# Patient Record
Sex: Female | Born: 1945
Health system: Southern US, Community
[De-identification: ages and names within clinical notes are randomized; demographics above are authoritative.]

## PROBLEM LIST (undated history)

## (undated) DIAGNOSIS — T7840XA Allergy, unspecified, initial encounter: Secondary | ICD-10-CM

## (undated) DIAGNOSIS — J449 Chronic obstructive pulmonary disease, unspecified: Secondary | ICD-10-CM

## (undated) DIAGNOSIS — I10 Essential (primary) hypertension: Secondary | ICD-10-CM

## (undated) DIAGNOSIS — E7800 Pure hypercholesterolemia, unspecified: Secondary | ICD-10-CM

## (undated) DIAGNOSIS — G709 Myoneural disorder, unspecified: Secondary | ICD-10-CM

## (undated) DIAGNOSIS — F329 Major depressive disorder, single episode, unspecified: Secondary | ICD-10-CM

## (undated) DIAGNOSIS — E079 Disorder of thyroid, unspecified: Secondary | ICD-10-CM

## (undated) DIAGNOSIS — J45909 Unspecified asthma, uncomplicated: Secondary | ICD-10-CM

## (undated) HISTORY — DX: Essential (primary) hypertension: I10

## (undated) HISTORY — DX: Disorder of thyroid, unspecified: E07.9

## (undated) HISTORY — PX: TUBAL LIGATION: SHX77

## (undated) HISTORY — DX: Unspecified asthma, uncomplicated: J45.909

## (undated) HISTORY — DX: Major depressive disorder, single episode, unspecified: F32.9

## (undated) HISTORY — DX: Chronic obstructive pulmonary disease, unspecified: J44.9

## (undated) HISTORY — DX: Myoneural disorder, unspecified: G70.9

## (undated) HISTORY — PX: FRACTURE SURGERY: SHX138

## (undated) HISTORY — PX: TONSILECTOMY/ADENOIDECTOMY WITH MYRINGOTOMY: SHX6125

## (undated) HISTORY — PX: SPINE SURGERY: SHX786

## (undated) HISTORY — DX: Allergy, unspecified, initial encounter: T78.40XA

---

## 2006-06-11 ENCOUNTER — Other Ambulatory Visit: Admission: RE | Admit: 2006-06-11 | Discharge: 2006-06-11 | Payer: Self-pay | Admitting: Internal Medicine

## 2006-10-30 ENCOUNTER — Encounter: Admission: RE | Admit: 2006-10-30 | Discharge: 2006-10-30 | Payer: Self-pay | Admitting: Family Medicine

## 2007-07-13 ENCOUNTER — Ambulatory Visit (HOSPITAL_COMMUNITY): Admission: RE | Admit: 2007-07-13 | Discharge: 2007-07-13 | Payer: Self-pay | Admitting: Internal Medicine

## 2007-10-17 ENCOUNTER — Ambulatory Visit (HOSPITAL_COMMUNITY): Admission: RE | Admit: 2007-10-17 | Discharge: 2007-10-18 | Payer: Self-pay | Admitting: Neurological Surgery

## 2008-05-09 ENCOUNTER — Encounter: Admission: RE | Admit: 2008-05-09 | Discharge: 2008-05-09 | Payer: Self-pay | Admitting: Family Medicine

## 2008-05-10 ENCOUNTER — Emergency Department (HOSPITAL_COMMUNITY): Admission: EM | Admit: 2008-05-10 | Discharge: 2008-05-10 | Payer: Self-pay | Admitting: Family Medicine

## 2008-07-13 ENCOUNTER — Ambulatory Visit (HOSPITAL_COMMUNITY): Admission: RE | Admit: 2008-07-13 | Discharge: 2008-07-13 | Payer: Self-pay | Admitting: Family Medicine

## 2009-08-01 ENCOUNTER — Encounter: Admission: RE | Admit: 2009-08-01 | Discharge: 2009-08-01 | Payer: Self-pay | Admitting: Family Medicine

## 2010-10-14 NOTE — Op Note (Signed)
Terry Allison, Terry Allison            ACCOUNT NO.:  [id number]   MEDICAL RECORD NO.:  [id number]          PATIENT TYPE:  INP   LOCATION:  3534                         FACILITY:  [facility]   PHYSICIAN:  Stefani Dama, M.D.  DATE OF BIRTH:  1945/08/20   DATE OF PROCEDURE:  10/17/2007  DATE OF DISCHARGE:                               OPERATIVE REPORT   PREOPERATIVE DIAGNOSES:  Cervical spondylosis with left cervical  radiculopathy, C5-6 and C6-7.   POSTOPERATIVE DIAGNOSES:  Cervical spondylosis with left cervical  radiculopathy, C5-6 and C6-7.   PROCEDURE:  Anterior cervical decompression, arthrodesis with structural  allograft, Alphatec plate fixation.   SURGEON:  Stefani Dama, MD.   FIRST ASSISTANT:  Hilda Lias, MD.   ANESTHESIA:  General endotracheal.   INDICATIONS:  Terry Allison is a 65 year old individual who has had  significant problems with neck, shoulder, and left arm pain.  She has  been inconsolable despite efforts at conservative management, was  advised regarding surgical decompression, arthrodesis at C5-6 and C6-7.  She has advanced spondylitic changes there.   PROCEDURE:  The patient was brought to the operating room supine on the  stretcher.  After smooth induction of general endotracheal anesthesia,  she was placed in 5 pounds halter traction.  Neck was prepped with  alcohol and DuraPrep, and draped in the sterile fashion.  Transverse  incision was made in left side of the neck, and this was carried down  through the platysmal plane between the sternocleidomastoid and the  strap muscles dissected bluntly until the prevertebral space was  reached.  First identifiable disk space was noted to be that of C5-C6 on  the localizing radiograph.  Longus coli muscle was stripped off either  side, and then Caspar blades were placed under the longus coli muscle to  maintain some permanent retraction.  Disk space at C5-6 was then opened  with a #15 blade.  Combination  of Kerrison rongeurs was used to remove  the substantial amount of ventral osteophytic material.  The disk space  was noted to be very narrow and compressed, and the disk material was  noted to be extremely desiccated.  It peeled away from either end plate  quite readily, and this dissection was carried out through the disk  space and then out to either lateral gutter.  The dissection was carried  down deep into the disk space, and self-retaining Caspar retractor was  placed deep into the wound, and then osteophytic material was ground  down from the inferior margin body of C5 out to the lateral recess; and  on the left side particularly, there was noted be substantial  osteophytic process from the uncinate spur process.  The uncinate  process was drilled down, and the spur was removed, and the lateral  recess was decompressed in this fashion.  The posterior longitudinal  ligament was opened all the way to expose the common dural tube in its  junction with the exit of the C6 nerve root.  On the right side, a  similar decompression was carried out.  Hemostasis in the epidural  spaces here was then  obtained with some small pledgets, Gelfoam soaked  in thrombin, which were later irrigated away.  At this point, also  attention to C6-C7 was undertaken, where a similar decompression was  carried out.  Here, the uncinate spur from the inferior margin body of  C6 was not quite as large as it was at C5-C6.  Lateral recess on the  left side, however, was significantly stenotic; and this was opened up  by drilling mostly on the uncinate process on that left side.  Right  side was similarly decompressed.  In the end, hemostasis was achieved in  the similar fashion.  Then, two 8-mm transgrafts were ground down to the  appropriate size and shaped to fit within the defect.  The central  cavities were enlarged, and they were filled with demineralized bone  matrix along with some of the bony material that  was harvested from  drilling of the uncinate spurs with the bone grafts being placed each  into the interspaces at C5-6 and C6-7.  There are countersunk  appropriately.  Traction could be removed.  Neck was placed in a slight  amount of flexion at this point, and a 34-mm standard size Alphatec  plate of the Trestle variety was used, and this was secured with  variable angle 14-mm screws.  Final localizing radiograph identified  good position of the plate and the hardware and fairly normal contour to  the cervical curvature.  At this point, then hemostasis was checked in  the soft tissues; and the paravertebral tissues were examined carefully.  Prevertebral hemostasis was also achieved; and once this was secured,  then the platysma was closed with 3-0 Vicryl interrupted fashion, 3-0  Vicryl was used in the subcuticular tissues, and a dry sterile dressing  was then placed on the skin.  Blood loss for the procedure was estimated  less than 50 mL.      Stefani Dama, M.D.  Electronically Signed     HJE/MEDQ  D:  10/17/2007  T:  10/18/2007  Job:  [id number]

## 2011-02-25 LAB — CBC
HCT: 36.9
MCHC: 35
Platelets: 190
RDW: 13.4

## 2011-02-25 LAB — BASIC METABOLIC PANEL
CO2: 25
Calcium: 9.4
GFR calc non Af Amer: >60
Glucose, Bld: 110 — ABNORMAL HIGH
Potassium: 4.1

## 2011-06-23 ENCOUNTER — Ambulatory Visit (INDEPENDENT_AMBULATORY_CARE_PROVIDER_SITE_OTHER): Payer: BC Managed Care – PPO

## 2011-06-23 DIAGNOSIS — E785 Hyperlipidemia, unspecified: Secondary | ICD-10-CM

## 2011-06-23 DIAGNOSIS — J40 Bronchitis, not specified as acute or chronic: Secondary | ICD-10-CM

## 2011-06-23 DIAGNOSIS — M545 Low back pain, unspecified: Secondary | ICD-10-CM

## 2011-06-23 DIAGNOSIS — E039 Hypothyroidism, unspecified: Secondary | ICD-10-CM

## 2011-06-23 DIAGNOSIS — Z79899 Other long term (current) drug therapy: Secondary | ICD-10-CM

## 2011-06-23 DIAGNOSIS — I10 Essential (primary) hypertension: Secondary | ICD-10-CM

## 2011-07-22 ENCOUNTER — Telehealth: Payer: Self-pay

## 2011-07-22 NOTE — Telephone Encounter (Signed)
Pt states that she has chest and head congestion.  She stated that she did get a little better and not as worse as she was before.  Advised pt she may have to come back in but she wanted to see if we could maybe call in something instead her coming back in.  Her chart is at nurses station. MR [id number]

## 2011-07-22 NOTE — Telephone Encounter (Signed)
Pt is still not feeling any better she is still stuffy with chest and head congestion, she would like to see if Dr will call another round of antibiotic in. [facility]

## 2011-07-23 NOTE — Telephone Encounter (Signed)
Spoke with pt and gave instrs that needs re-eval bf anything else could be Rxd. Pt understood

## 2011-07-23 NOTE — Telephone Encounter (Signed)
I don't see that we've seen her since 06/30/2011.  She'll need to return for re-eval.

## 2012-06-14 ENCOUNTER — Other Ambulatory Visit: Payer: Self-pay | Admitting: Internal Medicine

## 2012-06-14 NOTE — Telephone Encounter (Signed)
Chart pulled to PA pool at nurses station [phone]

## 2012-07-03 ENCOUNTER — Ambulatory Visit (INDEPENDENT_AMBULATORY_CARE_PROVIDER_SITE_OTHER): Payer: Medicare Other | Admitting: Family Medicine

## 2012-07-03 VITALS — BP 167/89 | HR 69 | Temp 97.9°F | Resp 18 | Ht 60.0 in | Wt 117.2 lb

## 2012-07-03 DIAGNOSIS — J310 Chronic rhinitis: Secondary | ICD-10-CM

## 2012-07-03 DIAGNOSIS — E785 Hyperlipidemia, unspecified: Secondary | ICD-10-CM

## 2012-07-03 DIAGNOSIS — I10 Essential (primary) hypertension: Secondary | ICD-10-CM

## 2012-07-03 DIAGNOSIS — E039 Hypothyroidism, unspecified: Secondary | ICD-10-CM

## 2012-07-03 LAB — COMPREHENSIVE METABOLIC PANEL
ALT: 14 U/L (ref 0–35)
Alkaline Phosphatase: 57 U/L (ref 39–117)
CO2: 20 mEq/L (ref 19–32)
Calcium: 10.1 mg/dL (ref 8.4–10.5)
Potassium: 4.6 mEq/L (ref 3.5–5.3)
Total Bilirubin: 0.5 mg/dL (ref 0.3–1.2)
Total Protein: 7.3 g/dL (ref 6.0–8.3)

## 2012-07-03 LAB — LIPID PANEL: Total CHOL/HDL Ratio: 4.4 Ratio

## 2012-07-03 MED ORDER — METOPROLOL TARTRATE 50 MG PO TABS: 50.0000 mg | ORAL_TABLET | Freq: Two times a day (BID) | ORAL | Status: DC

## 2012-07-03 MED ORDER — FLUTICASONE PROPIONATE 50 MCG/ACT NA SUSP: 2.0000 | Freq: Every day | NASAL | Status: DC

## 2012-07-03 MED ORDER — BENAZEPRIL HCL 20 MG PO TABS: 20.0000 mg | ORAL_TABLET | Freq: Two times a day (BID) | ORAL | Status: DC

## 2012-07-03 MED ORDER — LEVOTHYROXINE SODIUM 75 MCG PO TABS: 75.0000 ug | ORAL_TABLET | Freq: Every day | ORAL | Status: DC

## 2012-07-03 NOTE — Patient Instructions (Signed)
Take your medications faithfully.  Followup on your blood pressure  I would advise returning in about 6 months so we can review everything and more time since several things are not inordinate today, the blood pressure and cholesterol.  I will decide your cholesterol medication after seeing the results of your labs today.

## 2012-07-03 NOTE — Progress Notes (Signed)
Subjective: 67 year old lady who is here for the first time in over a year. She has a history of hypothyroidism, hypercholesterolemia, and hypertension. She is generally take her medications faithfully. However she did stop taking her cholesterol medication for over a month ago, the atorvastatin.  She also ran out of her metoprolol a few days ago. This is tax season, and she is getting busy as a IT trainer. She does not get much exercise this time the year. She has persistent rhinitis with stuffy nose and blowing stuff out. Otherwise she's been doing very well.  Objective: Pleasant lady in no major distress. TMs normal. Throat clear. Neck supple without nodes or thyromegaly. Chest is clear to auscultation. Heart regular without murmurs gallops or arrhythmias. Abdomen soft and nontender. No ankle edema.  Assessment: Hypertension, unsatisfactory control Hyperlipidemia Hypothyroidism Chronic rhinitis  Plan: Resume her regular medications except we will await and see with the Lipitor before putting her back on medication. She was reluctant to take a statin, worrying about her legs. She thinks it coming off of that may have helped her legs be a little less discomfort and easier to get up and down, however she realizes that cardiovascular risk. She has been unwilling to go back on for some. She just was taking one half of a 40 mg atorvastatin pill. Will treat her rhinitis with Flonase.  Results for orders placed in visit on 07/03/12  POCT GLYCOSYLATED HEMOGLOBIN (HGB A1C)      Component Value Range   Hemoglobin A1C 5.3

## 2012-07-04 ENCOUNTER — Encounter: Payer: Self-pay | Admitting: Family Medicine

## 2012-07-04 ENCOUNTER — Other Ambulatory Visit: Payer: Self-pay | Admitting: Family Medicine

## 2012-07-04 DIAGNOSIS — E7800 Pure hypercholesterolemia, unspecified: Secondary | ICD-10-CM

## 2012-07-04 MED ORDER — ATORVASTATIN CALCIUM 40 MG PO TABS: ORAL_TABLET | ORAL | Status: DC

## 2012-08-10 ENCOUNTER — Other Ambulatory Visit: Payer: Self-pay | Admitting: Internal Medicine

## 2012-10-24 ENCOUNTER — Other Ambulatory Visit: Payer: Self-pay | Admitting: Internal Medicine

## 2013-03-24 ENCOUNTER — Emergency Department (HOSPITAL_COMMUNITY)
Admission: EM | Admit: 2013-03-24 | Discharge: 2013-03-24 | Disposition: A | Discharge status: Home / Self Care | Payer: Medicare Other | Attending: Emergency Medicine | Admitting: Emergency Medicine

## 2013-03-24 ENCOUNTER — Encounter (HOSPITAL_COMMUNITY): Payer: Self-pay | Admitting: Emergency Medicine

## 2013-03-24 ENCOUNTER — Emergency Department (HOSPITAL_COMMUNITY): Payer: Medicare Other

## 2013-03-24 DIAGNOSIS — Y939 Activity, unspecified: Secondary | ICD-10-CM | POA: Insufficient documentation

## 2013-03-24 DIAGNOSIS — Z87891 Personal history of nicotine dependence: Secondary | ICD-10-CM | POA: Insufficient documentation

## 2013-03-24 DIAGNOSIS — J4489 Other specified chronic obstructive pulmonary disease: Secondary | ICD-10-CM | POA: Insufficient documentation

## 2013-03-24 DIAGNOSIS — S93602A Unspecified sprain of left foot, initial encounter: Secondary | ICD-10-CM

## 2013-03-24 DIAGNOSIS — S42302A Unspecified fracture of shaft of humerus, left arm, initial encounter for closed fracture: Secondary | ICD-10-CM

## 2013-03-24 DIAGNOSIS — S42209A Unspecified fracture of upper end of unspecified humerus, initial encounter for closed fracture: Secondary | ICD-10-CM | POA: Insufficient documentation

## 2013-03-24 DIAGNOSIS — Z8669 Personal history of other diseases of the nervous system and sense organs: Secondary | ICD-10-CM | POA: Insufficient documentation

## 2013-03-24 DIAGNOSIS — J449 Chronic obstructive pulmonary disease, unspecified: Secondary | ICD-10-CM | POA: Insufficient documentation

## 2013-03-24 DIAGNOSIS — W010XXA Fall on same level from slipping, tripping and stumbling without subsequent striking against object, initial encounter: Secondary | ICD-10-CM | POA: Insufficient documentation

## 2013-03-24 DIAGNOSIS — E079 Disorder of thyroid, unspecified: Secondary | ICD-10-CM | POA: Insufficient documentation

## 2013-03-24 DIAGNOSIS — Y9289 Other specified places as the place of occurrence of the external cause: Secondary | ICD-10-CM | POA: Insufficient documentation

## 2013-03-24 DIAGNOSIS — I10 Essential (primary) hypertension: Secondary | ICD-10-CM | POA: Insufficient documentation

## 2013-03-24 DIAGNOSIS — Z8659 Personal history of other mental and behavioral disorders: Secondary | ICD-10-CM | POA: Insufficient documentation

## 2013-03-24 DIAGNOSIS — S93609A Unspecified sprain of unspecified foot, initial encounter: Secondary | ICD-10-CM | POA: Insufficient documentation

## 2013-03-24 DIAGNOSIS — Z79899 Other long term (current) drug therapy: Secondary | ICD-10-CM | POA: Insufficient documentation

## 2013-03-24 DIAGNOSIS — Z7982 Long term (current) use of aspirin: Secondary | ICD-10-CM | POA: Insufficient documentation

## 2013-03-24 MED ORDER — FENTANYL CITRATE 0.05 MG/ML IJ SOLN: 50.0000 ug | INTRAMUSCULAR | Status: DC | PRN

## 2013-03-24 MED ORDER — ONDANSETRON HCL 4 MG/2ML IJ SOLN
4.0000 mg | Freq: Once | INTRAMUSCULAR | Status: AC
  Administered 2013-03-24: 4 mg via INTRAVENOUS

## 2013-03-24 MED ORDER — HYDROCODONE-ACETAMINOPHEN 5-325 MG PO TABS: 2.0000 | ORAL_TABLET | Freq: Four times a day (QID) | ORAL | Status: DC | PRN

## 2013-03-24 MED ORDER — SODIUM CHLORIDE 0.9 % IV SOLN
INTRAVENOUS | Status: DC
  Administered 2013-03-24: 02:00:00 via INTRAVENOUS

## 2013-03-24 MED ORDER — ONDANSETRON HCL 4 MG/2ML IJ SOLN
INTRAMUSCULAR | Status: AC
  Filled 2013-03-24: qty 2

## 2013-03-24 MED ORDER — HYDROMORPHONE HCL PF 1 MG/ML IJ SOLN
1.0000 mg | Freq: Once | INTRAMUSCULAR | Status: AC
  Administered 2013-03-24: 1 mg via INTRAVENOUS
  Filled 2013-03-24: qty 1

## 2013-03-24 MED ORDER — ONDANSETRON HCL 4 MG PO TABS: 4.0000 mg | ORAL_TABLET | Freq: Four times a day (QID) | ORAL | Status: DC

## 2013-03-24 NOTE — ED Notes (Signed)
Pt states she tripped over a pair of shoes coming out of the bathroom. Pt states she landed on her left shoulder. Pt complaining of pain to her left shoulder unable to move left arm. Pt denies LOC

## 2013-03-24 NOTE — ED Provider Notes (Signed)
CSN: [id number]     Arrival date & time 03/24/13  0038 History   First MD Initiated Contact with Patient 03/24/13 0053     Chief Complaint  Patient presents with  . Shoulder Injury   (Consider location/radiation/quality/duration/timing/severity/associated sxs/prior Treatment) HPI History provided by patient. At home tonight getting ready for bed and tripped over her slippers falling onto her left outstretched arm.  She injured her left shoulder. She denies striking her head or hurting her neck. No other injuries from this fall. She complains of severe sharp pain in the area of her shoulder, unable to move her shoulder due to sharp pain. No associated weakness or numbness. She denies any wrist or elbow pain.    She twisted her left foot the day before while walking. No fall. He is able to walk since this happened but has developed bruising over the top of her foot. She is requesting an x-ray. No ankle pain. No bleeding or skin breaks. Past Medical History  Diagnosis Date  . Allergy   . Asthma   . COPD (chronic obstructive pulmonary disease)   . Depression   . Neuromuscular disorder   . Thyroid disease   . Hypertension    Past Surgical History  Procedure Laterality Date  . Tubal ligation    . Spine surgery    . Tonsilectomy/adenoidectomy with myringotomy     History reviewed. No pertinent family history. History  Substance Use Topics  . Smoking status: Former Games developer  . Smokeless tobacco: Not on file     Comment: quit 15 yr ago  . Alcohol Use: Yes     Comment: daily 3-4 glasses of wine   OB History   Grav Para Term Preterm Abortions TAB SAB Ect Mult Living                 Review of Systems  Constitutional: Negative for fever and chills.  Eyes: Negative for visual disturbance.  Respiratory: Negative for shortness of breath.   Cardiovascular: Negative for chest pain.  Gastrointestinal: Negative for abdominal pain.  Genitourinary: Negative for dysuria.  Musculoskeletal:  Negative for back pain, neck pain and neck stiffness.  Skin: Positive for wound. Negative for rash.  Neurological: Negative for syncope, weakness and headaches.  All other systems reviewed and are negative.    Allergies  Tramadol  Home Medications   Current Outpatient Rx  Name  Route  Sig  Dispense  Refill  . aspirin EC 81 MG tablet   Oral   Take 81 mg by mouth daily.         Marland Kitchen atorvastatin (LIPITOR) 40 MG tablet   Oral   Take 20 mg by mouth daily.          . benazepril (LOTENSIN) 20 MG tablet   Oral   Take 1 tablet (20 mg total) by mouth 2 (two) times daily.   180 tablet   3   . levothyroxine (SYNTHROID, LEVOTHROID) 75 MCG tablet   Oral   Take 1 tablet (75 mcg total) by mouth daily.   90 tablet   3   . metoprolol (LOPRESSOR) 50 MG tablet   Oral   Take 1 tablet (50 mg total) by mouth 2 (two) times daily.   180 tablet   3    BP 145/67  Pulse 57  Temp(Src) 97.6 F (36.4 C) (Oral)  SpO2 99% Physical Exam  Constitutional: She is oriented to person, place, and time. She appears well-developed and well-nourished.  HENT:  Head: Normocephalic and atraumatic.  Eyes: EOM are normal. Pupils are equal, round, and reactive to light.  Neck: Neck supple.  No C-spine tenderness or deformity  Cardiovascular: Normal rate, regular rhythm and intact distal pulses.   Pulmonary/Chest: Effort normal and breath sounds normal. No respiratory distress.  Abdominal: Soft. She exhibits no distension. There is no tenderness.  Musculoskeletal:  Left upper extremity with tenderness over the proximal humerus. No tenderness or deformity to the shoulder. Decreased range of motion at the shoulder secondary to pain. No tenderness over elbow or wrist with distal neurovascular intact. Skin intact throughout. Left foot with ecchymosis and mild swelling over the dorsum. There is some mild tenderness. Skin intact. Good pulses in distal cap refill. Distal motor and sensorium intact. No tenderness  over the ankle. Gait intact  Neurological: She is alert and oriented to person, place, and time.  Skin: Skin is warm and dry.    ED Course  Procedures (including critical care time) Labs Review Labs Reviewed - No data to display Imaging Review Dg Shoulder Left  03/24/2013   CLINICAL DATA:  Pain and bruising of the left humerus after a fall.  EXAM: LEFT SHOULDER - 2+ VIEW  COMPARISON:  None.  FINDINGS: Comminuted fractures of the proximal left humerus with transverse fracture line along the left humeral neck and vertical fracture lines along the base of the greater tuberosity. Fracture line appears to extend to the inferior aspect of the glenohumeral joint. Mild impaction of the fracture fragments with mild lateral angulation of the distal fracture fragment. No glenohumeral dislocation. The acromioclavicular joint appears intact. Old appearing left rib fracture.  IMPRESSION: Comminuted fractures of the left proximal humerus.   Electronically Signed   By: Burman Nieves M.D.   On: 03/24/2013 01:56   Dg Humerus Left  03/24/2013   CLINICAL DATA:  Pain and bruising around the left humerus after a fall.  EXAM: LEFT HUMERUS - 2+ VIEW  COMPARISON:  None.  FINDINGS: There is a transverse fracture through the left humeral neck with comminuted fragments extending along the greater tuberosity. Mild impaction and displacement of fracture fragments. Fracture line appears to extend to the inferior glenohumeral joint. No additional humeral fractures. Incidental note of a old left rib fracture.  IMPRESSION: Comminuted fracture of the proximal left humerus with transverse fracture line along the humeral neck and vertical fracture line along the base of the greater tuberosity.   Electronically Signed   By: Burman Nieves M.D.   On: 03/24/2013 01:55   Dg Foot Complete Left  03/24/2013   CLINICAL DATA:  Pain and bruising of the left foot over the 2nd through 5th metatarsal area after a fall.  EXAM: LEFT FOOT -  COMPLETE 3+ VIEW  COMPARISON:  None.  FINDINGS: Mild degenerative changes in the 1st metatarsophalangeal joint. Old ununited ossicle adjacent to the navicular and cuboid old bones. No evidence of acute fracture or subluxation. No focal bone lesion or bone destruction. No radiopaque soft tissue foreign bodies.  IMPRESSION: No acute bony abnormalities.   Electronically Signed   By: Burman Nieves M.D.   On: 03/24/2013 01:58   IV dilaudid. IV Zofran. IV fluids. Sling provided X-rays reviewed with patient.  Plan discharge home with close orthopedic followup. Prescription for pain medications provided. Fracture instructions and precautions provided. Patient stable for discharge home with her husband MDM  Diagnosis: Closed fracture left humerus, sprain left foot  Evaluated with x-rays reviewed as above. Treated with IV narcotics Sling provided  Vital signs and nursing notes reviewed and considered   Sunnie Nielsen, MD 03/24/13 [phone]

## 2013-04-23 ENCOUNTER — Telehealth: Payer: Self-pay | Admitting: *Deleted

## 2013-04-23 NOTE — Telephone Encounter (Signed)
Called and spoke with patient reminding her to please schedule a mammogram before end of calendar year 2014.

## 2013-07-11 ENCOUNTER — Ambulatory Visit (INDEPENDENT_AMBULATORY_CARE_PROVIDER_SITE_OTHER): Payer: Medicare HMO | Admitting: Family Medicine

## 2013-07-11 ENCOUNTER — Other Ambulatory Visit: Payer: Self-pay | Admitting: Family Medicine

## 2013-07-11 VITALS — BP 134/76 | HR 52 | Temp 98.5°F | Resp 16 | Ht 59.5 in | Wt 111.6 lb

## 2013-07-11 DIAGNOSIS — R0982 Postnasal drip: Secondary | ICD-10-CM

## 2013-07-11 DIAGNOSIS — D7589 Other specified diseases of blood and blood-forming organs: Secondary | ICD-10-CM

## 2013-07-11 DIAGNOSIS — E785 Hyperlipidemia, unspecified: Secondary | ICD-10-CM

## 2013-07-11 DIAGNOSIS — E7800 Pure hypercholesterolemia, unspecified: Secondary | ICD-10-CM

## 2013-07-11 DIAGNOSIS — E039 Hypothyroidism, unspecified: Secondary | ICD-10-CM | POA: Insufficient documentation

## 2013-07-11 DIAGNOSIS — M549 Dorsalgia, unspecified: Secondary | ICD-10-CM

## 2013-07-11 DIAGNOSIS — I10 Essential (primary) hypertension: Secondary | ICD-10-CM

## 2013-07-11 LAB — COMPREHENSIVE METABOLIC PANEL
ALT: 20 U/L (ref 0–35)
AST: 27 U/L (ref 0–37)
Albumin: 4.1 g/dL (ref 3.5–5.2)
Alkaline Phosphatase: 62 U/L (ref 39–117)
BUN: 14 mg/dL (ref 6–23)
CO2: 22 mEq/L (ref 19–32)
Calcium: 9.2 mg/dL (ref 8.4–10.5)
Chloride: 109 mEq/L (ref 96–112)
Creat: 0.94 mg/dL (ref 0.50–1.10)
GLUCOSE: 70 mg/dL (ref 70–99)
Potassium: 4.7 mEq/L (ref 3.5–5.3)
SODIUM: 143 meq/L (ref 135–145)
TOTAL PROTEIN: 6.6 g/dL (ref 6.0–8.3)
Total Bilirubin: 0.5 mg/dL (ref 0.2–1.2)

## 2013-07-11 LAB — CBC
HEMATOCRIT: 38.1 % (ref 36.0–46.0)
Hemoglobin: 12.9 g/dL (ref 12.0–15.0)
MCH: 35.1 pg — ABNORMAL HIGH (ref 26.0–34.0)
MCHC: 33.9 g/dL (ref 30.0–36.0)
MCV: 103.5 fL — AB (ref 78.0–100.0)
Platelets: 188 10*3/uL (ref 150–400)
RBC: 3.68 MIL/uL — AB (ref 3.87–5.11)
RDW: 14.9 % (ref 11.5–15.5)
WBC: 5.2 10*3/uL (ref 4.0–10.5)

## 2013-07-11 LAB — LIPID PANEL
Cholesterol: 256 mg/dL — ABNORMAL HIGH (ref 0–200)
HDL: 77 mg/dL (ref >39)
Total CHOL/HDL Ratio: 3.3 Ratio
Triglycerides: 403 mg/dL — ABNORMAL HIGH (ref <150)

## 2013-07-11 LAB — TSH: TSH: 0.909 u[IU]/mL (ref 0.350–4.500)

## 2013-07-11 MED ORDER — LEVOTHYROXINE SODIUM 75 MCG PO TABS: 75.0000 ug | ORAL_TABLET | Freq: Every day | ORAL | Status: DC

## 2013-07-11 MED ORDER — BENAZEPRIL HCL 20 MG PO TABS: 20.0000 mg | ORAL_TABLET | Freq: Two times a day (BID) | ORAL | Status: DC

## 2013-07-11 MED ORDER — IPRATROPIUM BROMIDE 0.03 % NA SOLN: 2.0000 | Freq: Four times a day (QID) | NASAL | Status: DC

## 2013-07-11 MED ORDER — ATORVASTATIN CALCIUM 40 MG PO TABS: 20.0000 mg | ORAL_TABLET | Freq: Every day | ORAL | Status: DC

## 2013-07-11 MED ORDER — METHOCARBAMOL 500 MG PO TABS: 500.0000 mg | ORAL_TABLET | Freq: Four times a day (QID) | ORAL | Status: DC | PRN

## 2013-07-11 MED ORDER — METOPROLOL TARTRATE 50 MG PO TABS: 50.0000 mg | ORAL_TABLET | Freq: Two times a day (BID) | ORAL | Status: DC

## 2013-07-11 NOTE — Progress Notes (Signed)
[facility] [address], [name] [address] [phone]  Date:  07/11/2013   Name:  Terry Allison   DOB:  1945/11/21   MRN:  [id number]  PCP:  No PCP Per Patient    Chief Complaint: Medication Refill   History of Present Illness:  Terry Allison is a 68 y.o. very pleasant female patient who presents with the following:  She is here today for a refill of her medicatoins.  She has done well since we saw her last year except she did break her left arm in October. This is finally better.   She also notes sinus and throat congestion for several months- she does not have a cough, the sx are not in her chest.  She has clear mucus from he nose but nothing discolored.  She has tried flonase but did not have much luck.  She also does have a history of back trouble and has used some medication for pain in the past.  She has had surgery on her c- spine in the past.  She now notes lower back pain which has bothered her off and on for several months; she had been on vicodin for her arm fracture and since she stopped using this her pain is more noticeable.  The pain does not radiate to her legs, no numbness or weakness of her legs, and no bowel or bladder control issues.   She is fasting today or labs  There are no active problems to display for this patient.   Past Medical History  Diagnosis Date  . Allergy   . Asthma   . COPD (chronic obstructive pulmonary disease)   . Depression   . Neuromuscular disorder   . Thyroid disease   . Hypertension     Past Surgical History  Procedure Laterality Date  . Tubal ligation    . Spine surgery    . Tonsilectomy/adenoidectomy with myringotomy      History  Substance Use Topics  . Smoking status: Former Games developermoker  . Smokeless tobacco: Not on file     Comment: quit 15 yr ago  . Alcohol Use: Yes     Comment: daily 3-4 glasses of wine    Family History  Problem Relation Age of Onset  . Heart disease Father   .  Mental illness Father   . Cancer Sister   . Hyperlipidemia Sister     Allergies  Allergen Reactions  . Tramadol Nausea And Vomiting    Falling down     Medication list has been reviewed and updated.  Current Outpatient Prescriptions on File Prior to Visit  Medication Sig Dispense Refill  . aspirin EC 81 MG tablet Take 81 mg by mouth daily.      Marland Kitchen. atorvastatin (LIPITOR) 40 MG tablet Take 20 mg by mouth daily.       . benazepril (LOTENSIN) 20 MG tablet Take 1 tablet (20 mg total) by mouth 2 (two) times daily.  180 tablet  3  . levothyroxine (SYNTHROID, LEVOTHROID) 75 MCG tablet Take 1 tablet (75 mcg total) by mouth daily.  90 tablet  3  . metoprolol (LOPRESSOR) 50 MG tablet Take 1 tablet (50 mg total) by mouth 2 (two) times daily.  180 tablet  3  . HYDROcodone-acetaminophen (NORCO/VICODIN) 5-325 MG per tablet Take 2 tablets by mouth every 6 (six) hours as needed for pain.  20 tablet  0  . ondansetron (ZOFRAN) 4 MG tablet Take 1 tablet (4  mg total) by mouth every 6 (six) hours.  12 tablet  0   No current facility-administered medications on file prior to visit.    Review of Systems:  As per HPI- otherwise negative.   Physical Examination: Filed Vitals:   07/11/13 0944  BP: 134/76  Pulse: 52  Temp: 98.5 F (36.9 C)  Resp: 16   Filed Vitals:   07/11/13 0944  Height: 4' 11.5" (1.511 m)  Weight: 111 lb 9.6 oz (50.621 kg)   Body mass index is 22.17 kg/(m^2). Ideal Body Weight: Weight in (lb) to have BMI = 25: 125.6  GEN: WDWN, NAD, Non-toxic, A & O x 3, very petite person, looks well HEENT: Atraumatic, Normocephalic. Neck supple. No masses, No LAD.  Bilateral TM wnl, oropharynx normal.  PEERL,EOMI.   Ears and Nose: No external deformity. CV: RRR, No M/G/R. No JVD. No thrill. No extra heart sounds. PULM: CTA B, no wheezes, crackles, rhonchi. No retractions. No resp. distress. No accessory muscle use. ABD: S, NT, ND EXTR: No c/c/e NEURO Normal gait.  PSYCH: Normally  interactive. Conversant. Not depressed or anxious appearing.  Calm demeanor.  Back: negative SLR bilaterally, normal leg strength, sensation and DTR bilaterally. No back pain present today No tenderness over lumber spine or paraspinous muscles. Normal back flexion and extension  Assessment and Plan: Hypertension - Plan: benazepril (LOTENSIN) 20 MG tablet, metoprolol (LOPRESSOR) 50 MG tablet, CBC  Hypothyroidism - Plan: levothyroxine (SYNTHROID, LEVOTHROID) 75 MCG tablet, TSH  Other and unspecified hyperlipidemia - Plan: atorvastatin (LIPITOR) 40 MG tablet, Comprehensive metabolic panel, Lipid panel  Back pain - Plan: methocarbamol (ROBAXIN) 500 MG tablet  PND (post-nasal drip) - Plan: ipratropium (ATROVENT) 0.03 % nasal spray  bp controlled, continue medications Check TSH and refilled synthroid today Check FLP and refilled lipitor Try atrovent nasal for her nasal symptoms Robaxin as needed for back pain  Signed Abbe Amsterdam, MD

## 2013-07-11 NOTE — Patient Instructions (Addendum)
Good to see you today. I will be in touch with your labs when they come in.   Try the atrovent nasal spray as needed for your nasal and throat symptoms You can also try the robaxin (muscle relaxer) as needed for your back. This CAN make you drowsy so use with caution.  It is also ok to try OTC ibuprofen or tylenol along with this.    Let me know if your nasal/ sinus or back symptoms are not improving

## 2013-07-13 ENCOUNTER — Encounter: Payer: Self-pay | Admitting: Family Medicine

## 2013-07-14 LAB — FOLATE: Folate: 6.1 ng/mL

## 2013-07-14 LAB — VITAMIN B12: Vitamin B-12: 345 pg/mL (ref 211–911)

## 2013-07-15 ENCOUNTER — Encounter: Payer: Self-pay | Admitting: Family Medicine

## 2013-07-15 NOTE — Addendum Note (Signed)
Addended by: Abbe AmsterdamOPLAND, Kaleab Frasier C on: 07/15/2013 07:29 AM   Modules accepted: Orders

## 2013-07-27 ENCOUNTER — Other Ambulatory Visit: Payer: Self-pay | Admitting: Family Medicine

## 2013-07-27 DIAGNOSIS — E785 Hyperlipidemia, unspecified: Secondary | ICD-10-CM

## 2013-07-27 DIAGNOSIS — S39012A Strain of muscle, fascia and tendon of lower back, initial encounter: Secondary | ICD-10-CM

## 2013-07-28 NOTE — Telephone Encounter (Signed)
Dr Patsy Lageropland, do you want to give pt RFs or RTC? If so, pls review quantity and # of RFs.

## 2013-07-29 NOTE — Telephone Encounter (Signed)
I refilled this medication.  Please give her a call and find out how her back pain is doing

## 2013-07-31 NOTE — Telephone Encounter (Signed)
Pt stated that she thinks the Robaxin is really helping her back pain. She did get a notice though from ins advising that it needs a PA or change to preferred med and they only gave a 10 day supply. Pt will CB later to give me more details of meds used in past in order to complete PA.

## 2013-08-01 MED ORDER — ATORVASTATIN CALCIUM 40 MG PO TABS: 40.0000 mg | ORAL_TABLET | Freq: Every day | ORAL | Status: DC

## 2013-08-01 NOTE — Telephone Encounter (Signed)
Pt CB and advised she has been on lyrica, celebrex and neurontin in the past for her back pain. She has not used another muscle relaxant in the past. Pt also asked that I resend in corrected Rx for her lipitor which Dr Patsy Lageropland had increased dose to a whole 40 mg tab, but # did not increase so she will run out in 1 1/2 mos. Resent lipitor per note on 07/13/13 letter from Dr Patsy Lageropland.

## 2013-08-01 NOTE — Telephone Encounter (Signed)
Completed PA form [facility] and faxed back to them w/confirmation. Should have decision w/in 48-72 hrs.

## 2013-08-01 NOTE — Addendum Note (Signed)
Addended by: Sheppard PlumberBRIGGS, Chrishawna Farina A on: 08/01/2013 11:03 AM   Modules accepted: Orders

## 2013-08-03 ENCOUNTER — Telehealth: Payer: Self-pay

## 2013-08-03 NOTE — Telephone Encounter (Signed)
PA approved for methocarbamol through 05/31/14. Pt notified.

## 2013-12-19 ENCOUNTER — Ambulatory Visit (INDEPENDENT_AMBULATORY_CARE_PROVIDER_SITE_OTHER): Payer: Medicare HMO | Admitting: Internal Medicine

## 2013-12-19 VITALS — BP 128/74 | HR 60 | Temp 98.1°F | Resp 16 | Ht 59.5 in | Wt 111.0 lb

## 2013-12-19 DIAGNOSIS — N3001 Acute cystitis with hematuria: Secondary | ICD-10-CM

## 2013-12-19 DIAGNOSIS — R3915 Urgency of urination: Secondary | ICD-10-CM

## 2013-12-19 DIAGNOSIS — N3000 Acute cystitis without hematuria: Secondary | ICD-10-CM

## 2013-12-19 DIAGNOSIS — R300 Dysuria: Secondary | ICD-10-CM

## 2013-12-19 LAB — POCT URINALYSIS DIPSTICK
BILIRUBIN UA: NEGATIVE
Glucose, UA: NEGATIVE
Ketones, UA: NEGATIVE
Nitrite, UA: NEGATIVE
PH UA: 5
Protein, UA: 30
Spec Grav, UA: <=1.005
Urobilinogen, UA: 0.2

## 2013-12-19 LAB — POCT UA - MICROSCOPIC ONLY
Casts, Ur, LPF, POC: NEGATIVE
Crystals, Ur, HPF, POC: NEGATIVE
MUCUS UA: NEGATIVE
Yeast, UA: NEGATIVE

## 2013-12-19 MED ORDER — CIPROFLOXACIN HCL 500 MG PO TABS: 500.0000 mg | ORAL_TABLET | Freq: Two times a day (BID) | ORAL | Status: DC

## 2013-12-19 MED ORDER — PHENAZOPYRIDINE HCL 200 MG PO TABS: 200.0000 mg | ORAL_TABLET | Freq: Three times a day (TID) | ORAL | Status: DC | PRN

## 2013-12-19 NOTE — Progress Notes (Signed)
   Subjective:    Patient ID: Terry Allison, female    DOB: 09/26/1945, 68 y.o.   MRN: [id number]  HPI 68 year old female complains of frequent urination which started yesterday. When going this morning she saw a little blood in her urine.She has had no fevers, chills or abdominal pain. Stools are normal.  States this is first uti in years, and she is catching it early. Had utis as a child.  Review of Systems     Objective:   Physical Exam  Constitutional: She is oriented to person, place, and time. She appears well-developed and well-nourished. No distress.  HENT:  Head: Normocephalic.  Eyes: EOM are normal.  Neck: Normal range of motion.  Abdominal: There is no tenderness.  Neurological: She is alert and oriented to person, place, and time. No cranial nerve deficit. She exhibits normal muscle tone. Coordination normal.  Psychiatric: She has a normal mood and affect.     Results for orders placed in visit on 12/19/13  POCT UA - MICROSCOPIC ONLY      Result Value Ref Range   WBC, Ur, HPF, POC TNTC     RBC, urine, microscopic TNTC     Bacteria, U Microscopic TRACE     Mucus, UA NEG     Epithelial cells, urine per micros 2-6     Crystals, Ur, HPF, POC NEG     Casts, Ur, LPF, POC NEG     Yeast, UA NEG    POCT URINALYSIS DIPSTICK      Result Value Ref Range   Color, UA YELLOW     Clarity, UA CLOUDY     Glucose, UA NEG     Bilirubin, UA NEG     Ketones, UA NEG     Spec Grav, UA <=1.005     Blood, UA LARGE     pH, UA 5.0     Protein, UA 30     Urobilinogen, UA 0.2     Nitrite, UA NEG     Leukocytes, UA moderate (2+)     CS urine     Assessment & Plan:  Cipro/Pyridium

## 2013-12-19 NOTE — Patient Instructions (Signed)

## 2013-12-21 LAB — URINE CULTURE: Colony Count: >=100000

## 2014-03-01 ENCOUNTER — Emergency Department (HOSPITAL_COMMUNITY): Payer: Medicare HMO

## 2014-03-01 ENCOUNTER — Encounter (HOSPITAL_COMMUNITY): Payer: Self-pay | Admitting: Radiology

## 2014-03-01 ENCOUNTER — Inpatient Hospital Stay (HOSPITAL_COMMUNITY)
Admission: EM | Admit: 2014-03-01 | Discharge: 2014-03-09 | DRG: 492 | Disposition: A | Discharge status: Skilled Nursing Facility | Payer: Medicare HMO | Attending: General Surgery | Admitting: General Surgery

## 2014-03-01 ENCOUNTER — Inpatient Hospital Stay (HOSPITAL_COMMUNITY): Payer: Medicare HMO

## 2014-03-01 DIAGNOSIS — S82401A Unspecified fracture of shaft of right fibula, initial encounter for closed fracture: Secondary | ICD-10-CM | POA: Diagnosis present

## 2014-03-01 DIAGNOSIS — D62 Acute posthemorrhagic anemia: Secondary | ICD-10-CM | POA: Diagnosis not present

## 2014-03-01 DIAGNOSIS — I10 Essential (primary) hypertension: Secondary | ICD-10-CM | POA: Diagnosis present

## 2014-03-01 DIAGNOSIS — F10129 Alcohol abuse with intoxication, unspecified: Secondary | ICD-10-CM | POA: Diagnosis present

## 2014-03-01 DIAGNOSIS — S065XAA Traumatic subdural hemorrhage with loss of consciousness status unknown, initial encounter: Secondary | ICD-10-CM | POA: Diagnosis present

## 2014-03-01 DIAGNOSIS — E785 Hyperlipidemia, unspecified: Secondary | ICD-10-CM | POA: Diagnosis present

## 2014-03-01 DIAGNOSIS — S82141A Displaced bicondylar fracture of right tibia, initial encounter for closed fracture: Secondary | ICD-10-CM | POA: Diagnosis present

## 2014-03-01 DIAGNOSIS — S82409A Unspecified fracture of shaft of unspecified fibula, initial encounter for closed fracture: Secondary | ICD-10-CM | POA: Diagnosis present

## 2014-03-01 DIAGNOSIS — S2239XA Fracture of one rib, unspecified side, initial encounter for closed fracture: Secondary | ICD-10-CM | POA: Diagnosis present

## 2014-03-01 DIAGNOSIS — S065X9A Traumatic subdural hemorrhage with loss of consciousness of unspecified duration, initial encounter: Secondary | ICD-10-CM | POA: Diagnosis present

## 2014-03-01 DIAGNOSIS — S42001A Fracture of unspecified part of right clavicle, initial encounter for closed fracture: Secondary | ICD-10-CM | POA: Diagnosis present

## 2014-03-01 DIAGNOSIS — F10929 Alcohol use, unspecified with intoxication, unspecified: Secondary | ICD-10-CM | POA: Diagnosis present

## 2014-03-01 DIAGNOSIS — S129XXA Fracture of neck, unspecified, initial encounter: Secondary | ICD-10-CM | POA: Diagnosis present

## 2014-03-01 DIAGNOSIS — T07XXXA Unspecified multiple injuries, initial encounter: Secondary | ICD-10-CM

## 2014-03-01 DIAGNOSIS — S0101XA Laceration without foreign body of scalp, initial encounter: Secondary | ICD-10-CM | POA: Diagnosis present

## 2014-03-01 DIAGNOSIS — S82892A Other fracture of left lower leg, initial encounter for closed fracture: Secondary | ICD-10-CM | POA: Diagnosis present

## 2014-03-01 DIAGNOSIS — S066XAA Traumatic subarachnoid hemorrhage with loss of consciousness status unknown, initial encounter: Secondary | ICD-10-CM | POA: Diagnosis present

## 2014-03-01 DIAGNOSIS — S42302K Unspecified fracture of shaft of humerus, left arm, subsequent encounter for fracture with nonunion: Secondary | ICD-10-CM | POA: Diagnosis not present

## 2014-03-01 DIAGNOSIS — S066X9A Traumatic subarachnoid hemorrhage with loss of consciousness of unspecified duration, initial encounter: Secondary | ICD-10-CM | POA: Diagnosis present

## 2014-03-01 DIAGNOSIS — E039 Hypothyroidism, unspecified: Secondary | ICD-10-CM | POA: Diagnosis present

## 2014-03-01 DIAGNOSIS — S2232XA Fracture of one rib, left side, initial encounter for closed fracture: Secondary | ICD-10-CM | POA: Diagnosis present

## 2014-03-01 DIAGNOSIS — S12500A Unspecified displaced fracture of sixth cervical vertebra, initial encounter for closed fracture: Secondary | ICD-10-CM | POA: Diagnosis present

## 2014-03-01 DIAGNOSIS — S82202A Unspecified fracture of shaft of left tibia, initial encounter for closed fracture: Secondary | ICD-10-CM | POA: Diagnosis present

## 2014-03-01 DIAGNOSIS — J449 Chronic obstructive pulmonary disease, unspecified: Secondary | ICD-10-CM | POA: Diagnosis present

## 2014-03-01 LAB — CBC
HCT: 38.4 % (ref 36.0–46.0)
Hemoglobin: 12.8 g/dL (ref 12.0–15.0)
MCH: 35.8 pg — ABNORMAL HIGH (ref 26.0–34.0)
MCHC: 33.3 g/dL (ref 30.0–36.0)
MCV: 107.3 fL — ABNORMAL HIGH (ref 78.0–100.0)
Platelets: 205 10*3/uL (ref 150–400)
RBC: 3.58 MIL/uL — ABNORMAL LOW (ref 3.87–5.11)
RDW: 13.7 % (ref 11.5–15.5)
WBC: 10.9 10*3/uL — AB (ref 4.0–10.5)

## 2014-03-01 LAB — COMPREHENSIVE METABOLIC PANEL
ALBUMIN: 4 g/dL (ref 3.5–5.2)
ALK PHOS: 74 U/L (ref 39–117)
ALT: 37 U/L — ABNORMAL HIGH (ref 0–35)
ANION GAP: 18 — AB (ref 5–15)
AST: 132 U/L — ABNORMAL HIGH (ref 0–37)
BILIRUBIN TOTAL: 0.3 mg/dL (ref 0.3–1.2)
BUN: 19 mg/dL (ref 6–23)
CO2: 21 mEq/L (ref 19–32)
Calcium: 9 mg/dL (ref 8.4–10.5)
Chloride: 104 mEq/L (ref 96–112)
Creatinine, Ser: 1.23 mg/dL — ABNORMAL HIGH (ref 0.50–1.10)
GFR calc non Af Amer: 45 mL/min — ABNORMAL LOW (ref >90)
GFR, EST AFRICAN AMERICAN: 52 mL/min — AB (ref >90)
GLUCOSE: 110 mg/dL — AB (ref 70–99)
POTASSIUM: 4.1 meq/L (ref 3.7–5.3)
Sodium: 143 mEq/L (ref 137–147)
TOTAL PROTEIN: 7.1 g/dL (ref 6.0–8.3)

## 2014-03-01 LAB — PREPARE FRESH FROZEN PLASMA
UNIT DIVISION: 0
Unit division: 0

## 2014-03-01 LAB — ABO/RH: ABO/RH(D): O POS

## 2014-03-01 LAB — PROTIME-INR
INR: 0.95 (ref 0.00–1.49)
PROTHROMBIN TIME: 12.7 s (ref 11.6–15.2)

## 2014-03-01 LAB — ETHANOL: Alcohol, Ethyl (B): 194 mg/dL — ABNORMAL HIGH (ref 0–11)

## 2014-03-01 LAB — CDS SEROLOGY

## 2014-03-01 MED ORDER — SODIUM CHLORIDE 0.9 % IV BOLUS (SEPSIS)
125.0000 mL | Freq: Once | INTRAVENOUS | Status: AC
  Administered 2014-03-01: 1000 mL via INTRAVENOUS

## 2014-03-01 MED ORDER — FENTANYL CITRATE 0.05 MG/ML IJ SOLN
50.0000 ug | Freq: Once | INTRAMUSCULAR | Status: AC
  Administered 2014-03-01: 50 ug via INTRAVENOUS
  Filled 2014-03-01: qty 2

## 2014-03-01 MED ORDER — FENTANYL CITRATE 0.05 MG/ML IJ SOLN
INTRAMUSCULAR | Status: AC
  Administered 2014-03-01: 50 ug
  Filled 2014-03-01: qty 2

## 2014-03-01 MED ORDER — FENTANYL CITRATE 0.05 MG/ML IJ SOLN
50.0000 ug | Freq: Once | INTRAMUSCULAR | Status: AC
  Administered 2014-03-01: 50 ug via INTRAVENOUS

## 2014-03-01 MED ORDER — IOHEXOL 300 MG/ML  SOLN
80.0000 mL | Freq: Once | INTRAMUSCULAR | Status: AC | PRN
  Administered 2014-03-01: 80 mL via INTRAVENOUS

## 2014-03-01 NOTE — ED Notes (Signed)
Port x-ray at bedside for right shoulder x-ray.  Pt remains alert and oriented x's 3.  Pt's son remains at bedside.

## 2014-03-01 NOTE — ED Notes (Signed)
Family at beside. Family given emotional support. 

## 2014-03-01 NOTE — ED Notes (Signed)
Pt to CT at this time.
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
## 2014-03-01 NOTE — ED Notes (Signed)
Pt remains alert and oriented x's 3, no active bleeding present at this time.
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
## 2014-03-01 NOTE — ED Notes (Signed)
Long leg splint applied by ortho tech

## 2014-03-01 NOTE — Progress Notes (Signed)
Orthopedic Tech Progress Note Patient Details:  Terry Jakschlizabeth H Houp [birth year] [id number]  Ortho Devices Type of Ortho Device: Ace wrap;Long leg splint Ortho Device/Splint Location: rlr Ortho Device/Splint Interventions: Application As ordered by Dr. Art BuffByerley  Teyanna Thielman 03/01/2014, 9:56 PM

## 2014-03-01 NOTE — ED Notes (Signed)
Pt remains alert and oriented x's 3.  Skin warm and dry, color appropriate.

## 2014-03-01 NOTE — ED Notes (Signed)
Return from CT.  Pt remains alert and oriented x's 3.  Pt c/o increasing pain to right leg also c/o pain to right shoulder.  Pain meds. Ordered for same.

## 2014-03-01 NOTE — ED Notes (Signed)
Dr. Wandra Feinstein. Murphy applied knee immobolizer over splint to right leg and talking to family
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
## 2014-03-01 NOTE — ED Notes (Signed)
Heat turned up in room.  Warm blankets applied to pt 

## 2014-03-02 ENCOUNTER — Inpatient Hospital Stay (HOSPITAL_COMMUNITY): Payer: Medicare HMO

## 2014-03-02 DIAGNOSIS — S42001A Fracture of unspecified part of right clavicle, initial encounter for closed fracture: Secondary | ICD-10-CM | POA: Diagnosis present

## 2014-03-02 LAB — BASIC METABOLIC PANEL
Anion gap: 13 (ref 5–15)
BUN: 15 mg/dL (ref 6–23)
CALCIUM: 7.9 mg/dL — AB (ref 8.4–10.5)
CO2: 21 mEq/L (ref 19–32)
CREATININE: 0.95 mg/dL (ref 0.50–1.10)
Chloride: 104 mEq/L (ref 96–112)
GFR calc Af Amer: 71 mL/min — ABNORMAL LOW (ref >90)
GFR, EST NON AFRICAN AMERICAN: 61 mL/min — AB (ref >90)
Glucose, Bld: 152 mg/dL — ABNORMAL HIGH (ref 70–99)
Potassium: 4.5 mEq/L (ref 3.7–5.3)
Sodium: 138 mEq/L (ref 137–147)

## 2014-03-02 LAB — URINE MICROSCOPIC-ADD ON

## 2014-03-02 LAB — CBC WITH DIFFERENTIAL/PLATELET
BASOS PCT: 0 % (ref 0–1)
Basophils Absolute: 0 10*3/uL (ref 0.0–0.1)
EOS PCT: 0 % (ref 0–5)
Eosinophils Absolute: 0 10*3/uL (ref 0.0–0.7)
HCT: 25.8 % — ABNORMAL LOW (ref 36.0–46.0)
HEMOGLOBIN: 8.5 g/dL — AB (ref 12.0–15.0)
Lymphocytes Relative: 33 % (ref 12–46)
Lymphs Abs: 2.7 10*3/uL (ref 0.7–4.0)
MCH: 35.4 pg — AB (ref 26.0–34.0)
MCHC: 32.9 g/dL (ref 30.0–36.0)
MCV: 107.5 fL — AB (ref 78.0–100.0)
MONOS PCT: 11 % (ref 3–12)
Monocytes Absolute: 0.9 10*3/uL (ref 0.1–1.0)
NEUTROS PCT: 56 % (ref 43–77)
Neutro Abs: 4.7 10*3/uL (ref 1.7–7.7)
PLATELETS: 156 10*3/uL (ref 150–400)
RBC: 2.4 MIL/uL — ABNORMAL LOW (ref 3.87–5.11)
RDW: 13.8 % (ref 11.5–15.5)
WBC: 8.4 10*3/uL (ref 4.0–10.5)

## 2014-03-02 LAB — CBC
HCT: 25.6 % — ABNORMAL LOW (ref 36.0–46.0)
HCT: 23.7 % — ABNORMAL LOW (ref 36.0–46.0)
HEMOGLOBIN: 7.9 g/dL — AB (ref 12.0–15.0)
Hemoglobin: 8.3 g/dL — ABNORMAL LOW (ref 12.0–15.0)
MCH: 34.2 pg — AB (ref 26.0–34.0)
MCH: 34.8 pg — ABNORMAL HIGH (ref 26.0–34.0)
MCHC: 32.4 g/dL (ref 30.0–36.0)
MCHC: 33.3 g/dL (ref 30.0–36.0)
MCV: 105.3 fL — AB (ref 78.0–100.0)
MCV: 104.4 fL — ABNORMAL HIGH (ref 78.0–100.0)
PLATELETS: 151 10*3/uL (ref 150–400)
Platelets: 123 10*3/uL — ABNORMAL LOW (ref 150–400)
RBC: 2.43 MIL/uL — ABNORMAL LOW (ref 3.87–5.11)
RBC: 2.27 MIL/uL — AB (ref 3.87–5.11)
RDW: 13.8 % (ref 11.5–15.5)
RDW: 13.5 % (ref 11.5–15.5)
WBC: 5.9 10*3/uL (ref 4.0–10.5)
WBC: 6 10*3/uL (ref 4.0–10.5)

## 2014-03-02 LAB — URINALYSIS, ROUTINE W REFLEX MICROSCOPIC
BILIRUBIN URINE: NEGATIVE
Glucose, UA: NEGATIVE mg/dL
KETONES UR: NEGATIVE mg/dL
NITRITE: NEGATIVE
Protein, ur: NEGATIVE mg/dL
Specific Gravity, Urine: 1.025 (ref 1.005–1.030)
UROBILINOGEN UA: 0.2 mg/dL (ref 0.0–1.0)
pH: 5.5 (ref 5.0–8.0)

## 2014-03-02 LAB — MRSA PCR SCREENING: MRSA BY PCR: NEGATIVE

## 2014-03-02 LAB — GLUCOSE, CAPILLARY: Glucose-Capillary: 124 mg/dL — ABNORMAL HIGH (ref 70–99)

## 2014-03-02 MED ORDER — IPRATROPIUM BROMIDE 0.06 % NA SOLN
2.0000 | Freq: Two times a day (BID) | NASAL | Status: DC
  Administered 2014-03-02 – 2014-03-08 (×9): 2 via NASAL
  Filled 2014-03-02: qty 30

## 2014-03-02 MED ORDER — ATORVASTATIN CALCIUM 40 MG PO TABS
40.0000 mg | ORAL_TABLET | Freq: Every day | ORAL | Status: DC
  Filled 2014-03-02: qty 1

## 2014-03-02 MED ORDER — METOPROLOL TARTRATE 50 MG PO TABS
50.0000 mg | ORAL_TABLET | Freq: Two times a day (BID) | ORAL | Status: DC
  Administered 2014-03-02 – 2014-03-09 (×15): 50 mg via ORAL
  Filled 2014-03-02 (×19): qty 1

## 2014-03-02 MED ORDER — DIPHENHYDRAMINE HCL 50 MG/ML IJ SOLN: 12.5000 mg | Freq: Four times a day (QID) | INTRAMUSCULAR | Status: DC | PRN

## 2014-03-02 MED ORDER — ONDANSETRON HCL 4 MG/2ML IJ SOLN
4.0000 mg | Freq: Four times a day (QID) | INTRAMUSCULAR | Status: DC | PRN
  Administered 2014-03-02: 4 mg via INTRAVENOUS
  Filled 2014-03-02 (×3): qty 2

## 2014-03-02 MED ORDER — CHLORHEXIDINE GLUCONATE 0.12 % MT SOLN
15.0000 mL | Freq: Two times a day (BID) | OROMUCOSAL | Status: DC
  Administered 2014-03-02 – 2014-03-03 (×2): 15 mL via OROMUCOSAL
  Filled 2014-03-02 (×4): qty 15

## 2014-03-02 MED ORDER — ONDANSETRON HCL 4 MG PO TABS: 4.0000 mg | ORAL_TABLET | Freq: Four times a day (QID) | ORAL | Status: DC | PRN

## 2014-03-02 MED ORDER — KCL IN DEXTROSE-NACL 20-5-0.45 MEQ/L-%-% IV SOLN
INTRAVENOUS | Status: DC
  Administered 2014-03-02 – 2014-03-05 (×6): via INTRAVENOUS
  Filled 2014-03-02 (×11): qty 1000

## 2014-03-02 MED ORDER — OXYCODONE-ACETAMINOPHEN 5-325 MG PO TABS
1.0000 | ORAL_TABLET | ORAL | Status: DC | PRN
  Administered 2014-03-02 (×2): 2 via ORAL
  Administered 2014-03-02: 1 via ORAL
  Administered 2014-03-03 – 2014-03-04 (×5): 2 via ORAL
  Filled 2014-03-02: qty 1
  Filled 2014-03-02 (×8): qty 2

## 2014-03-02 MED ORDER — ATORVASTATIN CALCIUM 40 MG PO TABS
40.0000 mg | ORAL_TABLET | Freq: Every day | ORAL | Status: DC
  Administered 2014-03-03 – 2014-03-08 (×5): 40 mg via ORAL
  Filled 2014-03-02 (×8): qty 1

## 2014-03-02 MED ORDER — FENTANYL CITRATE 0.05 MG/ML IJ SOLN
25.0000 ug | INTRAMUSCULAR | Status: DC | PRN
  Administered 2014-03-02 – 2014-03-05 (×8): 25 ug via INTRAVENOUS
  Filled 2014-03-02 (×8): qty 2

## 2014-03-02 MED ORDER — CETYLPYRIDINIUM CHLORIDE 0.05 % MT LIQD
7.0000 mL | Freq: Two times a day (BID) | OROMUCOSAL | Status: DC
  Administered 2014-03-02 (×2): 7 mL via OROMUCOSAL

## 2014-03-02 MED ORDER — LEVOTHYROXINE SODIUM 75 MCG PO TABS
75.0000 ug | ORAL_TABLET | Freq: Every day | ORAL | Status: DC
  Administered 2014-03-02 – 2014-03-09 (×8): 75 ug via ORAL
  Filled 2014-03-02 (×10): qty 1

## 2014-03-02 MED ORDER — BENAZEPRIL HCL 20 MG PO TABS
20.0000 mg | ORAL_TABLET | Freq: Two times a day (BID) | ORAL | Status: DC
  Administered 2014-03-02 – 2014-03-09 (×14): 20 mg via ORAL
  Filled 2014-03-02 (×18): qty 1

## 2014-03-02 MED ORDER — LACTATED RINGERS IV BOLUS (SEPSIS)
1000.0000 mL | Freq: Once | INTRAVENOUS | Status: AC
  Administered 2014-03-02: 1000 mL via INTRAVENOUS

## 2014-03-02 MED ORDER — SODIUM CHLORIDE 0.9 % IV SOLN
Freq: Once | INTRAVENOUS | Status: AC
  Administered 2014-03-02: 01:00:00 via INTRAVENOUS

## 2014-03-02 MED ORDER — INFLUENZA VAC SPLIT QUAD 0.5 ML IM SUSY
0.5000 mL | PREFILLED_SYRINGE | INTRAMUSCULAR | Status: DC
  Filled 2014-03-02: qty 0.5

## 2014-03-02 MED ORDER — CEFAZOLIN SODIUM 1-5 GM-% IV SOLN
1.0000 g | Freq: Four times a day (QID) | INTRAVENOUS | Status: AC
  Administered 2014-03-02 (×3): 1 g via INTRAVENOUS
  Filled 2014-03-02 (×4): qty 50

## 2014-03-02 MED ORDER — ONDANSETRON HCL 4 MG/2ML IJ SOLN
4.0000 mg | INTRAMUSCULAR | Status: DC | PRN
  Administered 2014-03-02 – 2014-03-03 (×3): 4 mg via INTRAVENOUS
  Filled 2014-03-02: qty 2

## 2014-03-02 MED ORDER — FLUTICASONE PROPIONATE 50 MCG/ACT NA SUSP
2.0000 | Freq: Two times a day (BID) | NASAL | Status: DC
  Administered 2014-03-02 – 2014-03-09 (×12): 2 via NASAL
  Filled 2014-03-02: qty 16

## 2014-03-02 MED ORDER — MORPHINE SULFATE 2 MG/ML IJ SOLN
1.0000 mg | INTRAMUSCULAR | Status: DC | PRN
  Administered 2014-03-02: 2 mg via INTRAVENOUS

## 2014-03-02 MED ORDER — ACETAMINOPHEN 500 MG PO TABS
1000.0000 mg | ORAL_TABLET | Freq: Four times a day (QID) | ORAL | Status: AC
  Administered 2014-03-02 (×3): 1000 mg via ORAL
  Filled 2014-03-02 (×5): qty 2

## 2014-03-02 MED ORDER — MORPHINE SULFATE 2 MG/ML IJ SOLN
INTRAMUSCULAR | Status: AC
  Filled 2014-03-02: qty 1

## 2014-03-02 NOTE — Consult Note (Signed)
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  EXAM: PORTABLE LEFT ANKLE - 2 VIEW  COMPARISON:  None.  FINDINGS: There is an undisplaced traumatic fracture of the distal left tibial metaphysis identified. It is mildly comminuted. Mild soft tissue swelling is seen. No definitive fibular abnormality is seen.  IMPRESSION: Undisplaced comminuted fracture of the distal tibial metaphysis on the left.   Electronically Signed   By: Inez Catalina M.D.   On: 03/02/2014 10:10    Review of Systems  Constitutional: Negative.   HENT: Negative.   Eyes: Negative.   Respiratory: Negative.   Cardiovascular: Negative.   Gastrointestinal: Negative.   Genitourinary: Negative.   Musculoskeletal: Positive for falls.  Skin: Negative.   Neurological: Negative.   Psychiatric/Behavioral: Negative.    Blood pressure 153/65, pulse 65, temperature 99.1 F (37.3 C), temperature source Oral, resp. rate 16, height _0  (1.575 m), weight 47.628 kg (105 lb), SpO2 97.00%. Physical Exam  Constitutional: She is oriented to person, place, and time. She appears  well-developed.  HENT:  Head: Normocephalic.  Abrasion to right side of  head  Eyes: Conjunctivae and EOM are normal. Pupils are equal, round, and reactive to light. Right eye exhibits no discharge. Left eye exhibits no discharge. Scleral icterus is present.  Neck: Normal range of motion. Neck supple.  Cardiovascular: Normal rate and normal heart sounds.   Respiratory: Effort normal and breath sounds normal.  GI: Soft. Bowel sounds are normal.  Musculoskeletal: Normal range of motion.  Neurological: She is alert and oriented to person, place, and time. She has normal reflexes. She displays normal reflexes. No cranial nerve deficit. She exhibits normal muscle tone. Coordination normal.  Skin: Skin is warm and dry.  Psychiatric: She has a normal mood and affect. Her behavior is normal. Judgment and thought content normal.    Assessment/Plan: 68 yo with traumatic sah.  This is a tiny amount of blood. No repeat scan is needed. She is normal on my exam, and I  would expect her to continue with a normal neurologic exam. . Call if there are questions  Dalvin Clipper L 03/02/2014, 7:56 PM

## 2014-03-02 NOTE — Progress Notes (Signed)
Drop in hemoglobin was real and we have no good source for blood loss.  Will keep in ICU until we can find a good reason for her blood loss.  Terry LamasJames O. Gae BonWyatt, III, MD, FACS [phone] Trauma Surgeon

## 2014-03-02 NOTE — Progress Notes (Signed)
She has a nondisplaced distal tibia fracture, could be amenable to non-op treatment but given that she will be NWB on the R leg, I will discuss possibly nailing the tibia to allow her to WB on it.     Laqueta Dueimothy Daniel Luismanuel Corman MD

## 2014-03-02 NOTE — Progress Notes (Signed)
Orthopedic Tech Progress Note Patient Details:  Terry Allison September 24, 1945 [id number]  Ortho Devices Type of Ortho Device: Arm sling Ortho Device/Splint Location: RUE Ortho Device/Splint Interventions: Application   Asia R Thompson 03/02/2014, 9:34 AM

## 2014-03-02 NOTE — ED Notes (Signed)
Pt continues to be alert and oriented x's 3, skin warm and dry color appropriate

## 2014-03-02 NOTE — Progress Notes (Signed)
Patient has a non-displaced fracture of her left ankle also.  She should be NWB on the left lower extremity  Marta LamasJames O. Gae BonWyatt, III, MD, FACS [phone] Trauma Surgeon
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
## 2014-03-02 NOTE — Progress Notes (Signed)
Patient transferred to [facility] with belongings and medications. CCMD notified of transfer. VSS. Patient complaining of pain, pre-medicated with pain medication prior to transfer. Receiving RN updated.   Rochele PagesHancock, Sheri Prows A, RN

## 2014-03-02 NOTE — Progress Notes (Signed)
Orthopedic Tech Progress Note Patient Details:  Terry Allison 1946-05-05 [id number] Post. SLS applied to LLE with stirrup. Order called in via Ortho MD to nurse.  Ortho Devices Type of Ortho Device: Ace wrap;Stirrup splint;Post (short leg) splint Ortho Device/Splint Location: LLE Ortho Device/Splint Interventions: Application   Asia R Thompson 03/02/2014, 3:27 PM
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
## 2014-03-03 LAB — CBC
HCT: 23.1 % — ABNORMAL LOW (ref 36.0–46.0)
HEMATOCRIT: 22.9 % — AB (ref 36.0–46.0)
HEMOGLOBIN: 7.7 g/dL — AB (ref 12.0–15.0)
Hemoglobin: 7.6 g/dL — ABNORMAL LOW (ref 12.0–15.0)
MCH: 34.2 pg — ABNORMAL HIGH (ref 26.0–34.0)
MCH: 35 pg — ABNORMAL HIGH (ref 26.0–34.0)
MCHC: 32.9 g/dL (ref 30.0–36.0)
MCHC: 33.6 g/dL (ref 30.0–36.0)
MCV: 104.1 fL — AB (ref 78.0–100.0)
MCV: 104.1 fL — ABNORMAL HIGH (ref 78.0–100.0)
Platelets: 123 10*3/uL — ABNORMAL LOW (ref 150–400)
Platelets: 131 10*3/uL — ABNORMAL LOW (ref 150–400)
RBC: 2.22 MIL/uL — AB (ref 3.87–5.11)
RBC: 2.2 MIL/uL — ABNORMAL LOW (ref 3.87–5.11)
RDW: 13.4 % (ref 11.5–15.5)
RDW: 13.2 % (ref 11.5–15.5)
WBC: 6.1 10*3/uL (ref 4.0–10.5)
WBC: 7.4 10*3/uL (ref 4.0–10.5)

## 2014-03-03 LAB — BASIC METABOLIC PANEL
Anion gap: 9 (ref 5–15)
BUN: 10 mg/dL (ref 6–23)
CHLORIDE: 100 meq/L (ref 96–112)
CO2: 23 meq/L (ref 19–32)
CREATININE: 0.82 mg/dL (ref 0.50–1.10)
Calcium: 7.9 mg/dL — ABNORMAL LOW (ref 8.4–10.5)
GFR calc Af Amer: 85 mL/min — ABNORMAL LOW (ref >90)
GFR calc non Af Amer: 73 mL/min — ABNORMAL LOW (ref >90)
Glucose, Bld: 128 mg/dL — ABNORMAL HIGH (ref 70–99)
Potassium: 5.3 mEq/L (ref 3.7–5.3)
Sodium: 132 mEq/L — ABNORMAL LOW (ref 137–147)

## 2014-03-03 MED ORDER — CETYLPYRIDINIUM CHLORIDE 0.05 % MT LIQD: 7.0000 mL | Freq: Two times a day (BID) | OROMUCOSAL | Status: DC

## 2014-03-03 MED ORDER — CETYLPYRIDINIUM CHLORIDE 0.05 % MT LIQD
7.0000 mL | Freq: Two times a day (BID) | OROMUCOSAL | Status: DC
  Administered 2014-03-03: 7 mL via OROMUCOSAL

## 2014-03-03 MED ORDER — PANTOPRAZOLE SODIUM 20 MG PO TBEC
20.0000 mg | DELAYED_RELEASE_TABLET | Freq: Every day | ORAL | Status: DC
  Administered 2014-03-03 – 2014-03-04 (×2): 20 mg via ORAL
  Filled 2014-03-03 (×3): qty 1

## 2014-03-03 NOTE — Progress Notes (Signed)
To OR Monday per ortho.  MS clear and no further CT scans recommended per NSU. TO SDU
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
## 2014-03-04 LAB — CBC WITH DIFFERENTIAL/PLATELET
BASOS ABS: 0 10*3/uL (ref 0.0–0.1)
Basophils Relative: 0 % (ref 0–1)
Eosinophils Absolute: 0.1 10*3/uL (ref 0.0–0.7)
Eosinophils Relative: 2 % (ref 0–5)
HEMATOCRIT: 26.4 % — AB (ref 36.0–46.0)
Hemoglobin: 8.9 g/dL — ABNORMAL LOW (ref 12.0–15.0)
LYMPHS PCT: 19 % (ref 12–46)
Lymphs Abs: 1.3 10*3/uL (ref 0.7–4.0)
MCH: 33.2 pg (ref 26.0–34.0)
MCHC: 33.7 g/dL (ref 30.0–36.0)
MCV: 98.5 fL (ref 78.0–100.0)
MONO ABS: 0.8 10*3/uL (ref 0.1–1.0)
Monocytes Relative: 12 % (ref 3–12)
Neutro Abs: 4.7 10*3/uL (ref 1.7–7.7)
Neutrophils Relative %: 67 % (ref 43–77)
PLATELETS: 132 10*3/uL — AB (ref 150–400)
RBC: 2.68 MIL/uL — ABNORMAL LOW (ref 3.87–5.11)
RDW: 17.5 % — AB (ref 11.5–15.5)
WBC: 6.9 10*3/uL (ref 4.0–10.5)

## 2014-03-04 LAB — CBC
HEMATOCRIT: 20.7 % — AB (ref 36.0–46.0)
Hemoglobin: 7 g/dL — ABNORMAL LOW (ref 12.0–15.0)
MCH: 35.9 pg — AB (ref 26.0–34.0)
MCHC: 33.8 g/dL (ref 30.0–36.0)
MCV: 106.2 fL — AB (ref 78.0–100.0)
Platelets: 121 10*3/uL — ABNORMAL LOW (ref 150–400)
RBC: 1.95 MIL/uL — ABNORMAL LOW (ref 3.87–5.11)
RDW: 13.4 % (ref 11.5–15.5)
WBC: 6.9 10*3/uL (ref 4.0–10.5)

## 2014-03-04 LAB — BASIC METABOLIC PANEL
Anion gap: 9 (ref 5–15)
BUN: 6 mg/dL (ref 6–23)
CALCIUM: 7.7 mg/dL — AB (ref 8.4–10.5)
CO2: 22 mEq/L (ref 19–32)
Chloride: 96 meq/L (ref 96–112)
Creatinine, Ser: 0.81 mg/dL (ref 0.50–1.10)
GFR calc Af Amer: 86 mL/min — ABNORMAL LOW (ref >90)
GFR calc non Af Amer: 74 mL/min — ABNORMAL LOW (ref >90)
GLUCOSE: 125 mg/dL — AB (ref 70–99)
Potassium: 4.3 meq/L (ref 3.7–5.3)
Sodium: 127 meq/L — ABNORMAL LOW (ref 137–147)

## 2014-03-04 LAB — PREPARE RBC (CROSSMATCH)

## 2014-03-04 MED ORDER — CEFAZOLIN SODIUM-DEXTROSE 2-3 GM-% IV SOLR
2.0000 g | INTRAVENOUS | Status: AC
  Administered 2014-03-05: 2 g via INTRAVENOUS
  Filled 2014-03-04: qty 50

## 2014-03-04 MED ORDER — DOCUSATE SODIUM 100 MG PO CAPS
100.0000 mg | ORAL_CAPSULE | Freq: Two times a day (BID) | ORAL | Status: DC
  Administered 2014-03-04 – 2014-03-09 (×10): 100 mg via ORAL
  Filled 2014-03-04 (×10): qty 1

## 2014-03-04 MED ORDER — SODIUM CHLORIDE 0.9 % IV SOLN
Freq: Once | INTRAVENOUS | Status: AC
  Administered 2014-03-04: 11:00:00 via INTRAVENOUS

## 2014-03-04 MED ORDER — ACETAMINOPHEN 500 MG PO TABS
1000.0000 mg | ORAL_TABLET | Freq: Once | ORAL | Status: DC
  Filled 2014-03-04: qty 2

## 2014-03-04 MED ORDER — OXYCODONE HCL 5 MG PO TABS
5.0000 mg | ORAL_TABLET | Freq: Four times a day (QID) | ORAL | Status: DC | PRN
  Administered 2014-03-04: 5 mg via ORAL
  Filled 2014-03-04: qty 1

## 2014-03-04 MED ORDER — DEXTROSE-NACL 5-0.45 % IV SOLN
100.0000 mL/h | INTRAVENOUS | Status: DC
  Administered 2014-03-05: 100 mL/h via INTRAVENOUS

## 2014-03-04 MED ORDER — POLYETHYLENE GLYCOL 3350 17 G PO PACK
17.0000 g | PACK | Freq: Every day | ORAL | Status: DC
  Administered 2014-03-04 – 2014-03-08 (×4): 17 g via ORAL
  Filled 2014-03-04 (×6): qty 1

## 2014-03-04 NOTE — Progress Notes (Signed)
Agree with above 
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
## 2014-03-04 NOTE — Progress Notes (Signed)
SPORTS MEDICINE AND JOINT REPLACEMENT  Terry SpurlingStephen Lucey, MD   Terry CabalMaurice Aeva Posey, PA-C [address] Terry Allison, [name], [name]  [id number]                             [phone]   PROGRESS NOTE  Subjective:  negative for Chest Pain  negative for Shortness of Breath  negative for Nausea/Vomiting   negative for Calf Pain  negative for Bowel Movement   Tolerating Diet: yes         Patient reports pain as 5 on 0-10 scale.    Objective: Vital signs in last 24 hours:   Patient Vitals for the past 24 hrs:  BP Temp Temp src Pulse Resp SpO2  03/04/14 0740 137/65 mmHg - - 70 15 100 %  03/04/14 0700 - 98.3 F (36.8 C) Oral - - -  03/04/14 0347 125/56 mmHg 98.7 F (37.1 C) Oral 69 12 91 %  03/04/14 0017 104/42 mmHg 98.6 F (37 C) Oral 63 10 91 %  03/03/14 2008 137/42 mmHg 99.1 F (37.3 C) Oral 66 16 94 %  03/03/14 1520 151/61 mmHg 98.5 F (36.9 C) Oral 67 11 96 %  03/03/14 1500 - - - 63 16 98 %  03/03/14 1400 157/58 mmHg - - 58 16 96 %  03/03/14 1300 - - - 58 21 97 %  03/03/14 1200 134/72 mmHg - - 64 15 97 %  03/03/14 1100 - - - 65 14 96 %  03/03/14 1050 - - - 66 - -  03/03/14 1000 158/58 mmHg - - 62 10 100 %    @flow {1959:LAST@   Intake/Output from previous day:   10/03 0701 - 10/04 0700 In: 2640 [P.O.:240; I.V.:2400] Out: 2020 [Urine:2020]   Intake/Output this shift:   10/04 0701 - 10/04 1900 In: -  Out: 1100 [Urine:1100]   Intake/Output     10/03 0701 - 10/04 0700 10/04 0701 - 10/05 0700   P.O. 240    I.V. (mL/kg) 2400 (50.4)    Total Intake(mL/kg) 2640 (55.4)    Urine (mL/kg/hr) 2020 (1.8) 1100 (8.8)   Total Output 2020 1100   Net +620 -1100           LABORATORY DATA:  Recent Labs  03/01/14 2121 03/02/14 0817 03/02/14 1032 03/02/14 2123 03/03/14 0247 03/03/14 1418 03/04/14 0220  WBC 10.9* 5.9 8.4 6.0 6.1 7.4 6.9  HGB 12.8 8.3* 8.5* 7.9* 7.6* 7.7* 7.0*  HCT 38.4 25.6* 25.8* 23.7* 23.1* 22.9* 20.7*  PLT 205 151 156 123* 123* 131* 121*    Recent Labs  03/01/14 2121 03/02/14 0817 03/03/14 0247 03/04/14 0220  NA 143 138 132* 127*  K 4.1 4.5 5.3 4.3  CL 104 104 100 96  CO2 21 21 23 22   BUN 19 15 10 6   CREATININE 1.23* 0.95 0.82 0.81  GLUCOSE 110* 152* 128* 125*  CALCIUM 9.0 7.9* 7.9* 7.7*   Lab Results  Component Value Date   INR 0.95 03/01/2014    Examination:  General appearance: alert, cooperative and no distress Extremities: extremities normal, atraumatic, no cyanosis or edema and Homans sign is negative, no sign of DVT  Wound Exam: clean, dry, intact   Drainage:  None: wound tissue dry  Motor Exam: EHL and FHL Intact  Sensory Exam: Deep Peroneal normal   Assessment:       Procedure(s) (LRB): OPEN REDUCTION INTERNAL FIXATION (ORIF) TIBIAL PLATEAU (Right)  ADDITIONAL DIAGNOSIS:  Active Problems:   SAH (subarachnoid hemorrhage)   Tibial plateau fracture, right   Closed fracture of right clavicle     Plan: Right proximal tibial orif         [name] 03/04/2014, 9:37 AM

## 2014-03-05 ENCOUNTER — Encounter (HOSPITAL_COMMUNITY): Admission: EM | Disposition: A | Discharge status: Skilled Nursing Facility | Payer: Self-pay | Source: Home / Self Care

## 2014-03-05 ENCOUNTER — Inpatient Hospital Stay (HOSPITAL_COMMUNITY): Payer: Medicare HMO

## 2014-03-05 ENCOUNTER — Inpatient Hospital Stay (HOSPITAL_COMMUNITY): Payer: Medicare HMO | Admitting: Anesthesiology

## 2014-03-05 ENCOUNTER — Encounter (HOSPITAL_COMMUNITY): Payer: Medicare HMO | Admitting: Anesthesiology

## 2014-03-05 DIAGNOSIS — S82401A Unspecified fracture of shaft of right fibula, initial encounter for closed fracture: Secondary | ICD-10-CM | POA: Diagnosis present

## 2014-03-05 DIAGNOSIS — S2232XA Fracture of one rib, left side, initial encounter for closed fracture: Secondary | ICD-10-CM | POA: Diagnosis present

## 2014-03-05 DIAGNOSIS — J309 Allergic rhinitis, unspecified: Secondary | ICD-10-CM | POA: Insufficient documentation

## 2014-03-05 DIAGNOSIS — D62 Acute posthemorrhagic anemia: Secondary | ICD-10-CM | POA: Diagnosis not present

## 2014-03-05 DIAGNOSIS — F10929 Alcohol use, unspecified with intoxication, unspecified: Secondary | ICD-10-CM | POA: Diagnosis present

## 2014-03-05 DIAGNOSIS — S129XXA Fracture of neck, unspecified, initial encounter: Secondary | ICD-10-CM | POA: Diagnosis present

## 2014-03-05 DIAGNOSIS — S42302K Unspecified fracture of shaft of humerus, left arm, subsequent encounter for fracture with nonunion: Secondary | ICD-10-CM | POA: Diagnosis present

## 2014-03-05 DIAGNOSIS — S0101XA Laceration without foreign body of scalp, initial encounter: Secondary | ICD-10-CM | POA: Diagnosis present

## 2014-03-05 DIAGNOSIS — E039 Hypothyroidism, unspecified: Secondary | ICD-10-CM | POA: Insufficient documentation

## 2014-03-05 DIAGNOSIS — S065XAA Traumatic subdural hemorrhage with loss of consciousness status unknown, initial encounter: Secondary | ICD-10-CM | POA: Diagnosis present

## 2014-03-05 DIAGNOSIS — I10 Essential (primary) hypertension: Secondary | ICD-10-CM | POA: Insufficient documentation

## 2014-03-05 DIAGNOSIS — K219 Gastro-esophageal reflux disease without esophagitis: Secondary | ICD-10-CM | POA: Insufficient documentation

## 2014-03-05 DIAGNOSIS — S82202A Unspecified fracture of shaft of left tibia, initial encounter for closed fracture: Secondary | ICD-10-CM | POA: Diagnosis present

## 2014-03-05 DIAGNOSIS — S065X9A Traumatic subdural hemorrhage with loss of consciousness of unspecified duration, initial encounter: Secondary | ICD-10-CM | POA: Diagnosis present

## 2014-03-05 HISTORY — PX: ORIF TIBIA PLATEAU: SHX2132

## 2014-03-05 LAB — TYPE AND SCREEN
ABO/RH(D): O POS
ABO/RH(D): O POS
ANTIBODY SCREEN: NEGATIVE
Antibody Screen: NEGATIVE
UNIT DIVISION: 0
UNIT DIVISION: 0
Unit division: 0

## 2014-03-05 LAB — BASIC METABOLIC PANEL
Anion gap: 11 (ref 5–15)
BUN: 6 mg/dL (ref 6–23)
CHLORIDE: 103 meq/L (ref 96–112)
CO2: 22 mEq/L (ref 19–32)
Calcium: 8.4 mg/dL (ref 8.4–10.5)
Creatinine, Ser: 0.79 mg/dL (ref 0.50–1.10)
GFR calc Af Amer: >90 mL/min (ref >90)
GFR calc non Af Amer: 85 mL/min — ABNORMAL LOW (ref >90)
GLUCOSE: 113 mg/dL — AB (ref 70–99)
POTASSIUM: 4.5 meq/L (ref 3.7–5.3)
Sodium: 136 mEq/L — ABNORMAL LOW (ref 137–147)

## 2014-03-05 LAB — CBC
HEMATOCRIT: 26.1 % — AB (ref 36.0–46.0)
HEMOGLOBIN: 8.7 g/dL — AB (ref 12.0–15.0)
MCH: 33.1 pg (ref 26.0–34.0)
MCHC: 33.3 g/dL (ref 30.0–36.0)
MCV: 99.2 fL (ref 78.0–100.0)
Platelets: 145 10*3/uL — ABNORMAL LOW (ref 150–400)
RBC: 2.63 MIL/uL — ABNORMAL LOW (ref 3.87–5.11)
RDW: 18 % — ABNORMAL HIGH (ref 11.5–15.5)
WBC: 7.2 10*3/uL (ref 4.0–10.5)

## 2014-03-05 SURGERY — OPEN REDUCTION INTERNAL FIXATION (ORIF) TIBIAL PLATEAU
Anesthesia: General | Site: Leg Lower | Laterality: Right

## 2014-03-05 MED ORDER — NEOSTIGMINE METHYLSULFATE 10 MG/10ML IV SOLN
INTRAVENOUS | Status: DC | PRN
  Administered 2014-03-05: 3 mg via INTRAVENOUS

## 2014-03-05 MED ORDER — FENTANYL CITRATE 0.05 MG/ML IJ SOLN
INTRAMUSCULAR | Status: DC | PRN
  Administered 2014-03-05 (×10): 50 ug via INTRAVENOUS

## 2014-03-05 MED ORDER — LACTATED RINGERS IV SOLN
INTRAVENOUS | Status: DC
  Administered 2014-03-05: 12:00:00 via INTRAVENOUS

## 2014-03-05 MED ORDER — OXYCODONE HCL 5 MG PO TABS: 5.0000 mg | ORAL_TABLET | Freq: Once | ORAL | Status: DC | PRN

## 2014-03-05 MED ORDER — 0.9 % SODIUM CHLORIDE (POUR BTL) OPTIME
TOPICAL | Status: DC | PRN
  Administered 2014-03-05: 1000 mL

## 2014-03-05 MED ORDER — PANTOPRAZOLE SODIUM 40 MG PO TBEC
40.0000 mg | DELAYED_RELEASE_TABLET | Freq: Every day | ORAL | Status: DC
  Administered 2014-03-06 – 2014-03-09 (×4): 40 mg via ORAL
  Filled 2014-03-05 (×4): qty 1

## 2014-03-05 MED ORDER — SUCCINYLCHOLINE CHLORIDE 20 MG/ML IJ SOLN
INTRAMUSCULAR | Status: DC | PRN
  Administered 2014-03-05: 100 mg via INTRAVENOUS

## 2014-03-05 MED ORDER — HYDROMORPHONE HCL 1 MG/ML IJ SOLN
INTRAMUSCULAR | Status: AC
  Administered 2014-03-05: 0.5 mg
  Filled 2014-03-05: qty 1

## 2014-03-05 MED ORDER — MIDAZOLAM HCL 2 MG/2ML IJ SOLN
INTRAMUSCULAR | Status: AC
  Filled 2014-03-05: qty 2

## 2014-03-05 MED ORDER — MEPERIDINE HCL 25 MG/ML IJ SOLN: 6.2500 mg | INTRAMUSCULAR | Status: DC | PRN

## 2014-03-05 MED ORDER — CEFAZOLIN SODIUM-DEXTROSE 2-3 GM-% IV SOLR
2.0000 g | Freq: Four times a day (QID) | INTRAVENOUS | Status: AC
  Administered 2014-03-05 – 2014-03-06 (×3): 2 g via INTRAVENOUS
  Filled 2014-03-05 (×3): qty 50

## 2014-03-05 MED ORDER — CEFAZOLIN SODIUM-DEXTROSE 2-3 GM-% IV SOLR
INTRAVENOUS | Status: DC | PRN
  Administered 2014-03-05: 2 g via INTRAVENOUS

## 2014-03-05 MED ORDER — LABETALOL HCL 5 MG/ML IV SOLN
INTRAVENOUS | Status: DC | PRN
  Administered 2014-03-05 (×5): 5 mg via INTRAVENOUS

## 2014-03-05 MED ORDER — ROCURONIUM BROMIDE 100 MG/10ML IV SOLN
INTRAVENOUS | Status: DC | PRN
  Administered 2014-03-05: 30 mg via INTRAVENOUS

## 2014-03-05 MED ORDER — FENTANYL CITRATE 0.05 MG/ML IJ SOLN
INTRAMUSCULAR | Status: AC
  Filled 2014-03-05: qty 5

## 2014-03-05 MED ORDER — ONDANSETRON HCL 4 MG/2ML IJ SOLN: 4.0000 mg | Freq: Once | INTRAMUSCULAR | Status: DC | PRN

## 2014-03-05 MED ORDER — OXYCODONE HCL 5 MG PO TABS
5.0000 mg | ORAL_TABLET | ORAL | Status: DC | PRN
  Administered 2014-03-05 – 2014-03-06 (×2): 10 mg via ORAL
  Administered 2014-03-06: 15 mg via ORAL
  Administered 2014-03-06 (×2): 10 mg via ORAL
  Administered 2014-03-06 – 2014-03-07 (×3): 15 mg via ORAL
  Administered 2014-03-07: 10 mg via ORAL
  Administered 2014-03-08: 15 mg via ORAL
  Administered 2014-03-08: 10 mg via ORAL
  Administered 2014-03-08 – 2014-03-09 (×3): 15 mg via ORAL
  Filled 2014-03-05 (×2): qty 3
  Filled 2014-03-05 (×2): qty 2
  Filled 2014-03-05 (×2): qty 3
  Filled 2014-03-05: qty 2
  Filled 2014-03-05 (×5): qty 3
  Filled 2014-03-05: qty 1
  Filled 2014-03-05: qty 3

## 2014-03-05 MED ORDER — LIDOCAINE HCL (CARDIAC) 20 MG/ML IV SOLN
INTRAVENOUS | Status: AC
  Filled 2014-03-05: qty 5

## 2014-03-05 MED ORDER — NEOSTIGMINE METHYLSULFATE 10 MG/10ML IV SOLN
INTRAVENOUS | Status: AC
  Filled 2014-03-05: qty 1

## 2014-03-05 MED ORDER — ROCURONIUM BROMIDE 50 MG/5ML IV SOLN
INTRAVENOUS | Status: AC
  Filled 2014-03-05: qty 1

## 2014-03-05 MED ORDER — PROPOFOL 10 MG/ML IV BOLUS
INTRAVENOUS | Status: AC
  Filled 2014-03-05: qty 20

## 2014-03-05 MED ORDER — GLYCOPYRROLATE 0.2 MG/ML IJ SOLN
INTRAMUSCULAR | Status: AC
Start: 2014-03-05 — End: 2014-03-05
  Filled 2014-03-05: qty 2

## 2014-03-05 MED ORDER — ONDANSETRON HCL 4 MG/2ML IJ SOLN
INTRAMUSCULAR | Status: DC | PRN
  Administered 2014-03-05: 4 mg via INTRAVENOUS

## 2014-03-05 MED ORDER — LIDOCAINE HCL (CARDIAC) 20 MG/ML IV SOLN
INTRAVENOUS | Status: DC | PRN
  Administered 2014-03-05: 60 mg via INTRAVENOUS

## 2014-03-05 MED ORDER — GLYCOPYRROLATE 0.2 MG/ML IJ SOLN
INTRAMUSCULAR | Status: DC | PRN
  Administered 2014-03-05: .4 mg via INTRAVENOUS

## 2014-03-05 MED ORDER — LACTATED RINGERS IV SOLN
INTRAVENOUS | Status: DC | PRN
  Administered 2014-03-05 (×2): via INTRAVENOUS

## 2014-03-05 MED ORDER — OXYCODONE HCL 5 MG/5ML PO SOLN: 5.0000 mg | Freq: Once | ORAL | Status: DC | PRN

## 2014-03-05 MED ORDER — DEXTROSE 5 % IV SOLN
0.0000 ug/min | INTRAVENOUS | Status: DC
  Filled 2014-03-05: qty 1

## 2014-03-05 MED ORDER — EPHEDRINE SULFATE 50 MG/ML IJ SOLN
INTRAMUSCULAR | Status: DC | PRN
  Administered 2014-03-05: 5 mg via INTRAVENOUS

## 2014-03-05 MED ORDER — CEFAZOLIN SODIUM-DEXTROSE 2-3 GM-% IV SOLR
INTRAVENOUS | Status: AC
  Filled 2014-03-05: qty 50

## 2014-03-05 MED ORDER — HYDROMORPHONE HCL 1 MG/ML IJ SOLN: 0.2500 mg | INTRAMUSCULAR | Status: DC | PRN

## 2014-03-05 MED ORDER — MIDAZOLAM HCL 5 MG/5ML IJ SOLN
INTRAMUSCULAR | Status: DC | PRN
  Administered 2014-03-05: 1 mg via INTRAVENOUS

## 2014-03-05 MED ORDER — HYDROMORPHONE HCL 1 MG/ML IJ SOLN
0.5000 mg | INTRAMUSCULAR | Status: DC | PRN
  Administered 2014-03-05 – 2014-03-09 (×3): 0.5 mg via INTRAVENOUS
  Filled 2014-03-05 (×3): qty 1

## 2014-03-05 MED ORDER — PROPOFOL 10 MG/ML IV BOLUS
INTRAVENOUS | Status: DC | PRN
  Administered 2014-03-05: 130 mg via INTRAVENOUS

## 2014-03-05 MED ORDER — LABETALOL HCL 5 MG/ML IV SOLN
INTRAVENOUS | Status: AC
  Filled 2014-03-05: qty 8

## 2014-03-05 SURGICAL SUPPLY — 72 items
BANDAGE ELASTIC 4 VELCRO ST LF (GAUZE/BANDAGES/DRESSINGS) ×3 IMPLANT
BANDAGE ELASTIC 6 VELCRO ST LF (GAUZE/BANDAGES/DRESSINGS) ×3 IMPLANT
BANDAGE ESMARK 6X9 LF (GAUZE/BANDAGES/DRESSINGS) ×1 IMPLANT
BIT DRILL 2.5X125 (BIT) ×3 IMPLANT
BLADE SURG 10 STRL SS (BLADE) ×3 IMPLANT
BLADE SURG 15 STRL LF DISP TIS (BLADE) ×1 IMPLANT
BLADE SURG 15 STRL SS (BLADE) ×3
BNDG CMPR 9X6 STRL LF SNTH (GAUZE/BANDAGES/DRESSINGS) ×1
BNDG COHESIVE 4X5 TAN STRL (GAUZE/BANDAGES/DRESSINGS) ×3 IMPLANT
BNDG ESMARK 6X9 LF (GAUZE/BANDAGES/DRESSINGS) ×3
BNDG GAUZE ELAST 4 BULKY (GAUZE/BANDAGES/DRESSINGS) ×3 IMPLANT
COVER MAYO STAND STRL (DRAPES) ×3 IMPLANT
COVER SURGICAL LIGHT HANDLE (MISCELLANEOUS) ×3 IMPLANT
CUFF TOURNIQUET SINGLE 34IN LL (TOURNIQUET CUFF) ×3 IMPLANT
DRAPE C-ARM 42X72 X-RAY (DRAPES) ×3 IMPLANT
DRAPE C-ARMOR (DRAPES) ×3 IMPLANT
DRAPE IMP U-DRAPE 54X76 (DRAPES) ×6 IMPLANT
DRAPE INCISE IOBAN 66X45 STRL (DRAPES) ×3 IMPLANT
DRAPE U-SHAPE 47X51 STRL (DRAPES) ×3 IMPLANT
DRSG ADAPTIC 3X8 NADH LF (GAUZE/BANDAGES/DRESSINGS) ×3 IMPLANT
DRSG PAD ABDOMINAL 8X10 ST (GAUZE/BANDAGES/DRESSINGS) ×3 IMPLANT
ELECT REM PT RETURN 9FT ADLT (ELECTROSURGICAL) ×3
ELECTRODE REM PT RTRN 9FT ADLT (ELECTROSURGICAL) ×1 IMPLANT
GAUZE SPONGE 4X4 12PLY STRL (GAUZE/BANDAGES/DRESSINGS) ×3 IMPLANT
GLOVE BIO SURGEON STRL SZ7.5 (GLOVE) ×3 IMPLANT
GLOVE BIOGEL PI IND STRL 8 (GLOVE) ×1 IMPLANT
GLOVE BIOGEL PI INDICATOR 8 (GLOVE) ×2
GOWN STRL REUS W/ TWL LRG LVL3 (GOWN DISPOSABLE) ×2 IMPLANT
GOWN STRL REUS W/TWL LRG LVL3 (GOWN DISPOSABLE) ×6
IMMOBILIZER KNEE 22 UNIV (SOFTGOODS) IMPLANT
KIT BASIN OR (CUSTOM PROCEDURE TRAY) ×3 IMPLANT
KIT ROOM TURNOVER OR (KITS) ×3 IMPLANT
MANIFOLD NEPTUNE II (INSTRUMENTS) ×3 IMPLANT
NDL SUT 6 .5 CRC .975X.05 MAYO (NEEDLE) IMPLANT
NEEDLE MAYO TAPER (NEEDLE)
NS IRRIG 1000ML POUR BTL (IV SOLUTION) ×3 IMPLANT
PACK ORTHO EXTREMITY (CUSTOM PROCEDURE TRAY) ×3 IMPLANT
PAD ARMBOARD 7.5X6 YLW CONV (MISCELLANEOUS) ×6 IMPLANT
PAD CAST 4YDX4 CTTN HI CHSV (CAST SUPPLIES) ×1 IMPLANT
PADDING CAST COTTON 4X4 STRL (CAST SUPPLIES) ×3
PIN STIENMAN 2.0 (PIN) ×15 IMPLANT
PLATE STRAIGHT 4 HOLE (Plate) ×3 IMPLANT
SCREW 3.5X46MM (Screw) ×3 IMPLANT
SCREW CANCELLOUS 4.0X60MM (Screw) ×3 IMPLANT
SCREW CORTEX ST MATTA 3.5X28MM (Screw) ×9 IMPLANT
SCREW CORTEX ST MATTA 3.5X32MM (Screw) ×3 IMPLANT
SCREW CORTEX ST MATTA 3.5X38M (Screw) ×3 IMPLANT
SCREW LOCKING 4.0X40MM (Screw) ×3 IMPLANT
SCREW LOCKING 4.0X50MM (Screw) ×3 IMPLANT
SCREW LOCKING 4.0X65MM (Screw) ×6 IMPLANT
SCREW LOCKING 60MM (Screw) ×3 IMPLANT
SPLINT PLASTER CAST XFAST 5X30 (CAST SUPPLIES) ×1 IMPLANT
SPLINT PLASTER XFAST SET 5X30 (CAST SUPPLIES) ×2
SPONGE LAP 18X18 X RAY DECT (DISPOSABLE) ×3 IMPLANT
STOCKINETTE IMPERVIOUS LG (DRAPES) ×3 IMPLANT
SUCTION FRAZIER TIP 10 FR DISP (SUCTIONS) ×3 IMPLANT
SUT FIBERWIRE #2 38 T-5 BLUE (SUTURE)
SUT FIBERWIRE 2-0 18 17.9 3/8 (SUTURE) ×3
SUT MNCRL AB 4-0 PS2 18 (SUTURE) ×3 IMPLANT
SUT MON AB 2-0 CT1 36 (SUTURE) ×3 IMPLANT
SUT VIC AB 0 CT1 27 (SUTURE) ×6
SUT VIC AB 0 CT1 27XBRD ANBCTR (SUTURE) ×2 IMPLANT
SUTURE FIBERWR #2 38 T-5 BLUE (SUTURE) IMPLANT
SUTURE FIBERWR 2-0 18 17.9 3/8 (SUTURE) ×1 IMPLANT
TOWEL OR 17X24 6PK STRL BLUE (TOWEL DISPOSABLE) ×3 IMPLANT
TOWEL OR 17X26 10 PK STRL BLUE (TOWEL DISPOSABLE) ×6 IMPLANT
TOWEL OR NON WOVEN STRL DISP B (DISPOSABLE) ×3 IMPLANT
TRAY FOLEY CATH 14FR (SET/KITS/TRAYS/PACK) IMPLANT
TUBE CONNECTING 12'X1/4 (SUCTIONS) ×1
TUBE CONNECTING 12X1/4 (SUCTIONS) ×2 IMPLANT
WATER STERILE IRR 1000ML POUR (IV SOLUTION) ×6 IMPLANT
YANKAUER SUCT BULB TIP NO VENT (SUCTIONS) ×3 IMPLANT

## 2014-03-05 NOTE — Transfer of Care (Signed)
Immediate Anesthesia Transfer of Care Note  Patient: Terry Allison  Procedure(s) Performed: Procedure(s): OPEN REDUCTION INTERNAL FIXATION (ORIF) TIBIAL PLATEAU (Right)  Patient Location: PACU  Anesthesia Type:General  Level of Consciousness: awake, alert  and confused  Airway & Oxygen Therapy: Patient Spontanous Breathing and Patient connected to nasal cannula oxygen  Post-op Assessment: Report given to PACU RN, Post -op Vital signs reviewed and stable, Patient moving all extremities and Patient moving all extremities X 4  Post vital signs: Reviewed and stable  Complications: No apparent anesthesia complications
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
## 2014-03-05 NOTE — Anesthesia Preprocedure Evaluation (Addendum)
Anesthesia Evaluation  Patient identified by MRN, date of birth, ID band Patient awake    Reviewed: Allergy & Precautions, H&P , NPO status , Patient's Chart, lab work & pertinent test results  Airway Mallampati: II TM Distance: >3 FB Neck ROM: Full    Dental no notable dental hx.    Pulmonary neg pulmonary ROS,  breath sounds clear to auscultation  Pulmonary exam normal       Cardiovascular hypertension, Pt. on medications and Pt. on home beta blockers negative cardio ROS  Rhythm:Regular Rate:Normal     Neuro/Psych Subarachnoid hemorrhage. negative neurological ROS  negative psych ROS   GI/Hepatic Neg liver ROS, GERD-  Medicated,  Endo/Other  Hypothyroidism   Renal/GU negative Renal ROS  negative genitourinary   Musculoskeletal negative musculoskeletal ROS (+)   Abdominal   Peds negative pediatric ROS (+)  Hematology  (+) anemia ,   Anesthesia Other Findings   Reproductive/Obstetrics negative OB ROS                          Anesthesia Physical Anesthesia Plan  ASA: II  Anesthesia Plan: General   Post-op Pain Management:    Induction: Intravenous  Airway Management Planned: Oral ETT  Additional Equipment:   Intra-op Plan:   Post-operative Plan: Extubation in OR  Informed Consent: I have reviewed the patients History and Physical, chart, labs and discussed the procedure including the risks, benefits and alternatives for the proposed anesthesia with the patient or authorized representative who has indicated his/her understanding and acceptance.   Dental advisory given  Plan Discussed with: CRNA  Anesthesia Plan Comments:         Anesthesia Quick Evaluation

## 2014-03-05 NOTE — Progress Notes (Signed)
Going to OR. Discussed plans going forward including SNF/rehab with her and her son. Patient examined and I agree with the assessment and plan  Violeta GelinasBurke Ova Gillentine, MD, MPH, FACS Trauma: [phone] General Surgery: [phone]  03/05/2014 10:47 AM

## 2014-03-05 NOTE — Anesthesia Procedure Notes (Signed)
Procedure Name: Intubation Date/Time: 03/05/2014 1:04 PM Performed by: Coralee RudFLORES, Jaylyn Iyer Pre-anesthesia Checklist: Patient identified, Emergency Drugs available, Suction available and Patient being monitored Patient Re-evaluated:Patient Re-evaluated prior to inductionOxygen Delivery Method: Circle system utilized Preoxygenation: Pre-oxygenation with 100% oxygen Intubation Type: IV induction Ventilation: Mask ventilation without difficulty Laryngoscope size: Elective Glidescope. Grade View: Grade I Tube type: Oral Tube size: 7.0 mm Number of attempts: 1 Airway Equipment and Method: Stylet Placement Confirmation: ETT inserted through vocal cords under direct vision and positive ETCO2 Secured at: 21 cm Tube secured with: Tape Dental Injury: Teeth and Oropharynx as per pre-operative assessment  Comments: Elective Glidescope because of neck fx.  (old or new)

## 2014-03-05 NOTE — Progress Notes (Signed)
Pt. Tylenol held due to history of cirrhosis of the liver discussed with Earney HamburgMichael Jeffrey and made aware of patients most recent vital signs. Silvio PateBarbara Charles Niese, RN.

## 2014-03-05 NOTE — H&P (View-Only) (Signed)
She has a nondisplaced distal tibia fracture, could be amenable to non-op treatment but given that she will be NWB on the R leg, I will discuss possibly nailing the tibia to allow her to WB on it.     Damare Serano Daniel Carah Barrientes MD 

## 2014-03-05 NOTE — Op Note (Signed)
03/01/2014 - 03/05/2014  3:32 PM  PATIENT:  Terry Allison    PRE-OPERATIVE DIAGNOSIS:  Right plateau fracture  POST-OPERATIVE DIAGNOSIS:  Same  PROCEDURE:  OPEN REDUCTION INTERNAL FIXATION (ORIF) TIBIAL PLATEAU  SURGEON:  Lashaun Poch, D, MD  ASSISTANT: Janace Litten, OPA, He was necessary for efficiency and safety of the case.   ANESTHESIA:   gen  PREOPERATIVE INDICATIONS:  SEDA Terry Allison is a  68 y.o. female with a diagnosis of Right plateau fracture who failed conservative measures and elected for surgical management.    The risks benefits and alternatives were discussed with the patient preoperatively including but not limited to the risks of infection, bleeding, nerve injury, cardiopulmonary complications, the need for revision surgery, among others, and the patient was willing to proceed.  OPERATIVE IMPLANTS: ant/lat locking plate, medial 1/3 tubular plate  OPERATIVE FINDINGS: Extremely unstable bicondylar plateau fracture.  BLOOD LOSS: min  COMPLICATIONS: none  TOURNIQUET TIME:  OPERATIVE PROCEDURE:  Patient was identified in the preoperative holding area and site was marked by me She was transported to the operating theater and placed on the table in supine position taking care to pad all bony prominences. After a preincinduction time out anesthesia was induced. The right lower extremity was prepped and draped in normal sterile fashion and a pre-incision timeout was performed. She received ancef for preoperative antibiotics.   Initially made a 4 cm medial incision at the posterior crest of the tibia I dissected down to the fascia and incised this in line with the incision. Identified the hamstring tendons and retracted them proximally.  Identified the spike of the medial piece of bone was significantly unstable I performed a reduction maneuver and used a K wire to hold it in place this required multiple attempts I was able to get into place after a few  tries. I then placed a one third tubular plate at the apex of this medial sided fracture and fixed in place with 2 screws. I elected to leave the top hole empty until after placing the lateral fixation.  I took multiple x-rays at this point was happy with the reduction of the medial side. I then turned my attention laterally where I made a 9 cm incision centered over the lateral thigh toe. I dissected down to the fascia and elevated anterior lateral compartment musculature off the crest of the tibia. There is significant comminution and instability as well some injury to the muscle in this compartment from the bone fragments.  Next I elevated the IT band and incise a small portion of this off of gerdy's tubercle and the lateral joint line. I then made a infra-meniscal capsulotomy and elevated this up I repaired the meniscus back to the capsule. I placed a tag stitch and this. I then identified the articular surface and performed a retractor to reduction pinning this back into place. I did use the clamps to close down the widening of the fracture. Next I selected the appropriate size plate and pinned it into place as well. As happy with the reduction and placement of the plate I placed a day cortical screw distally holding it in place and then placed 4 locking screws proximally followed by a kickstand screw as well. Took multiple x-rays and again was happy with all reductions. Placed 2 more distal cortical screws one more proximal cortical screw and then placed the remaining screws in the one third tubular plate on the medial side. After final x-rays as happy with the  position of all hardware and a reduction as well. The articular surface was well aligned visually as well as radiographically.  I then thoroughly irrigated both wounds I closed the medial skin with a nylon stitch on the lateral side I repaired the IT band and the joint capsule back down as well as the meniscus. This was tied down to the plate. I  then closed the skin with a nylon over here as well.  After placing a sterile dressing I resplinted the left leg at a more appropriate position.  POST OPERATIVE PLAN: Nonweightbearing bilateral lower extremities chemical prophylaxis per the trauma team this point no orthopedic contraindication to chemical prophylaxis. I would recommend aspirin.    This note was generated using a template and dragon dictation system. In light of that, I have reviewed the note and all aspects of it are applicable to this case. Any dictation errors are due to the computerized dictation system.

## 2014-03-05 NOTE — Interval H&P Note (Signed)
History and Physical Interval Note:  03/05/2014 12:30 PM  Terry JakschElizabeth H Allison  has presented today for surgery, with the diagnosis of Right plateau fracture  The various methods of treatment have been discussed with the patient and family. After consideration of risks, benefits and other options for treatment, the patient has consented to  Procedure(s): OPEN REDUCTION INTERNAL FIXATION (ORIF) TIBIAL PLATEAU (Right) as a surgical intervention .  The patient's history has been reviewed, patient examined, no change in status, stable for surgery.  I have reviewed the patient's chart and labs.  Questions were answered to the patient's satisfaction.     MURPHY, TIMOTHY, D

## 2014-03-06 MED ORDER — HEPARIN SODIUM (PORCINE) 5000 UNIT/ML IJ SOLN
5000.0000 [IU] | Freq: Three times a day (TID) | INTRAMUSCULAR | Status: DC
  Administered 2014-03-06 – 2014-03-09 (×10): 5000 [IU] via SUBCUTANEOUS
  Filled 2014-03-06 (×14): qty 1

## 2014-03-06 MED ORDER — WARFARIN - PHARMACIST DOSING INPATIENT
Freq: Every day | Status: DC
  Administered 2014-03-06: 1
  Administered 2014-03-07: 17:00:00

## 2014-03-06 MED ORDER — ACETAMINOPHEN 325 MG PO TABS
650.0000 mg | ORAL_TABLET | Freq: Four times a day (QID) | ORAL | Status: DC | PRN
  Administered 2014-03-06 – 2014-03-08 (×2): 650 mg via ORAL
  Filled 2014-03-06 (×2): qty 2

## 2014-03-06 MED ORDER — COUMADIN BOOK
Freq: Once | Status: AC
  Administered 2014-03-06: 10:00:00
  Filled 2014-03-06: qty 1

## 2014-03-06 MED ORDER — WARFARIN VIDEO
Freq: Once | Status: AC
  Administered 2014-03-06: 10:00:00

## 2014-03-06 MED ORDER — WARFARIN SODIUM 5 MG PO TABS
5.0000 mg | ORAL_TABLET | Freq: Once | ORAL | Status: AC
  Administered 2014-03-06: 5 mg via ORAL
  Filled 2014-03-06: qty 1
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
## 2014-03-06 NOTE — Anesthesia Postprocedure Evaluation (Signed)
Anesthesia Post Note  Patient: Terry Allison  Procedure(s) Performed: Procedure(s) (LRB): OPEN REDUCTION INTERNAL FIXATION (ORIF) TIBIAL PLATEAU (Right)  Anesthesia type: general  Patient location: PACU  Post pain: Pain level controlled  Post assessment: Patient's Cardiovascular Status Stable  Last Vitals:  Filed Vitals:   03/06/14 0829  BP: 143/56  Pulse: 82  Temp:   Resp: 21    Post vital signs: Reviewed and stable  Level of consciousness: sedated  Complications: No apparent anesthesia complications

## 2014-03-06 NOTE — Progress Notes (Signed)
Okay to go to 5N.  Placement to SNF appropriate.  This patient has been seen and I agree with the findings and treatment plan.  Jomari Bartnik O. Gae BonWyatt, III, MD, Marta LamasFACS [phone] (pager) [phone] (direct pager) Trauma Surgeon
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
## 2014-03-07 LAB — PROTIME-INR
INR: 1.21 (ref 0.00–1.49)
Prothrombin Time: 15.3 seconds — ABNORMAL HIGH (ref 11.6–15.2)

## 2014-03-07 MED ORDER — WARFARIN SODIUM 5 MG PO TABS
5.0000 mg | ORAL_TABLET | Freq: Once | ORAL | Status: AC
  Administered 2014-03-07: 5 mg via ORAL
  Filled 2014-03-07: qty 1

## 2014-03-07 NOTE — Progress Notes (Signed)
ANTICOAGULATION CONSULT NOTE - Follow Up Consult  Pharmacy Consult for coumadin Indication: VTE prophylaxis  Allergies  Allergen Reactions  . Tramadol     Patient Measurements: Height: 5\' 2"  (157.5 cm) Weight: 105 lb (47.628 kg) IBW/kg (Calculated) : 50.1  Vital Signs: Temp: 99 F (37.2 C) (10/07 1200) Temp Source: Oral (10/07 1200) BP: 125/54 mmHg (10/07 0740) Pulse Rate: 70 (10/07 0740)  Labs:  Recent Labs  03/04/14 1759 03/05/14 0302 03/05/14 1030 03/07/14 0254  HGB 8.9* 8.7*  --   --   HCT 26.4* 26.1*  --   --   PLT 132* 145*  --   --   LABPROT  --   --   --  15.3*  INR  --   --   --  1.21  CREATININE  --   --  0.79  --     Estimated Creatinine Clearance: 52 ml/min (by C-G formula based on Cr of 0.79).   Medications:  Scheduled:  . atorvastatin  40 mg Oral q1800  . benazepril  20 mg Oral BID  . docusate sodium  100 mg Oral BID  . fluticasone  2 spray Each Nare BID  . heparin subcutaneous  5,000 Units Subcutaneous 3 times per day  . Influenza vac split quadrivalent PF  0.5 mL Intramuscular Tomorrow-1000  . ipratropium  2 spray Each Nare BID  . levothyroxine  75 mcg Oral QAC breakfast  . metoprolol  50 mg Oral BID  . pantoprazole  40 mg Oral Daily  . polyethylene glycol  17 g Oral Daily  . Warfarin - Pharmacist Dosing Inpatient   Does not apply q1800    Assessment: 68 yo female s/p right tibial fracture s/P ORIF on coumadin day 2 for VTE prophylaxis and INR is 1.21 today. Patient noted on sq heparin.  Goal of Therapy:  INR 2-3 Monitor platelets by anticoagulation protocol: Yes   Plan:  -Continue coumadin 5mg  op today -Daily PT/INR  Harland GermanAndrew Lorik Guo, Pharm D 03/07/2014 12:48 PM   e

## 2014-03-07 NOTE — Progress Notes (Signed)
Transferred pt to 5N12.

## 2014-03-07 NOTE — Progress Notes (Signed)
Awaiting transfer to the floor.  This patient has been seen and I agree with the findings and treatment plan.  Marta LamasJames O. Gae BonWyatt, III, MD, FACS [phone] (pager) [phone] (direct pager) Trauma Surgeon
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
## 2014-03-07 NOTE — Clinical Social Work Placement (Addendum)
Clinical Social Work Department CLINICAL SOCIAL WORK PLACEMENT NOTE 03/07/2014  Patient:  Terry Allison,Terry Allison  Account Number:  [id number] Admit date:  03/01/2014  Clinical Social Worker:  Gwynne EdingerYSHEKA BIBBS, LCSWA  Date/time:  03/07/2014 12:21 PM  Clinical Social Work is seeking post-discharge placement for this patient at the following level of care:   SKILLED NURSING   (*CSW will update this form in Epic as items are completed)   03/07/2014  Patient/family provided with [facility] of Clinical Social Work's list of facilities offering this level of care within the geographic area requested by the patient (or if unable, by the patient's family).  03/07/2014  Patient/family informed of their freedom to choose among providers that offer the needed level of care, that participate in Medicare, Medicaid or managed care program needed by the patient, have an available bed and are willing to accept the patient.  03/07/2014  Patient/family informed of [facility]' ownership interest in [facility], as well as of the fact that they are under no obligation to receive care at this facility.  PASARR submitted to EDS on 03/07/2014 PASARR number received on 03/07/2014  FL2 transmitted to all facilities in geographic area requested by pt/family on  03/07/2014 FL2 transmitted to all facilities within larger geographic area on 03/07/2014  Patient informed that his/her managed care company has contracts with or will negotiate with  certain facilities, including the following:     Patient/family informed of bed offers received:  03/08/2014 [facility](Gina Davione Lenker, [place] [phone]) Patient chooses bed at [facility](Gina Sofia Vanmeter, [place] [phone]) Physician recommends and patient chooses bed at  [facility](Gina Anjanette Gilkey, LCSWA [phone])  Patient to be transferred to  [facility] on 03/09/2014 [facility](Gina Chevon Laufer, LCSWA [phone])   Patient to be transferred to facility by [facility](Gina Fawzi Melman, [place] [phone]) Patient and  family notified of transfer on 03/09/2014 - significant other and son at bedside along with pt - CSW updated all Name of family member notified:    The following physician request were entered in Epic:   Additional Comments: Dysheka Bibbs, MSW, LCSWA [phone]
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
## 2014-03-07 NOTE — Progress Notes (Signed)
[facility]

## 2014-03-08 LAB — PROTIME-INR
INR: 1.46 (ref 0.00–1.49)
Prothrombin Time: 17.7 seconds — ABNORMAL HIGH (ref 11.6–15.2)

## 2014-03-08 MED ORDER — WARFARIN SODIUM 5 MG PO TABS
5.0000 mg | ORAL_TABLET | Freq: Once | ORAL | Status: AC
  Administered 2014-03-08: 5 mg via ORAL
  Filled 2014-03-08: qty 1
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
## 2014-03-08 NOTE — Progress Notes (Signed)
intermittent
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
## 2014-03-08 NOTE — Progress Notes (Signed)
Did not eat much lunch because she did not like it. Otherwise stable and we are awaiting SNF placement. Patient examined and I agree with the assessment and plan  Violeta GelinasBurke Debar Plate, MD, MPH, FACS Trauma: [phone] General Surgery: [phone]  03/08/2014 1:22 PM
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
## 2014-03-09 LAB — PROTIME-INR
INR: 1.55 — ABNORMAL HIGH (ref 0.00–1.49)
Prothrombin Time: 18.6 seconds — ABNORMAL HIGH (ref 11.6–15.2)

## 2014-03-09 MED ORDER — POLYETHYLENE GLYCOL 3350 17 G PO PACK: 17.0000 g | PACK | Freq: Every day | ORAL | Status: DC

## 2014-03-09 MED ORDER — ACETAMINOPHEN 325 MG PO TABS: 650.0000 mg | ORAL_TABLET | Freq: Four times a day (QID) | ORAL | Status: DC | PRN

## 2014-03-09 MED ORDER — DSS 100 MG PO CAPS: 100.0000 mg | ORAL_CAPSULE | Freq: Two times a day (BID) | ORAL | Status: DC

## 2014-03-09 MED ORDER — WARFARIN SODIUM 5 MG PO TABS: 5.0000 mg | ORAL_TABLET | ORAL | Status: DC

## 2014-03-09 MED ORDER — OXYCODONE HCL 5 MG PO TABS: 5.0000 mg | ORAL_TABLET | ORAL | Status: DC | PRN

## 2014-03-09 MED ORDER — WARFARIN SODIUM 7.5 MG PO TABS
7.5000 mg | ORAL_TABLET | Freq: Once | ORAL | Status: DC
  Filled 2014-03-09: qty 1
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
## 2014-03-09 NOTE — Progress Notes (Signed)
Report was given to nurse at [facility].
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
## 2014-03-09 NOTE — Progress Notes (Signed)
Awaiting placement.  No apparent disposition yet.  Patient has been SNF ready since 10/6.  This patient has been seen and I agree with the findings and treatment plan.  Marta LamasJames O. Gae BonWyatt, III, MD, FACS [phone] (pager) [phone] (direct pager) Trauma Surgeon

## 2014-03-09 NOTE — Clinical Social Work Note (Addendum)
CSW met with pt, significant other and son at bedside to discuss disposition.  All are agreeable to dc plan of [facility].   Transportation: [facility]  Report: [place] authorization received: [id number]  Nonnie Done, [place] [phone]  Psychiatric & Orthopedics (5N 1-16) Clinical Social Worker
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
## 2014-03-09 NOTE — Progress Notes (Signed)
Medicare IM (Important Message) delivered to patient today by me in anticipation of discharge.  
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
## 2014-03-09 NOTE — Discharge Instructions (Signed)
Weight Bearing: NW bilateral lower extremities. WBAT R upper extremity  Dressings: Short leg splint and knee immobilizer at all times, bilateral lower. Sling for comfort. Dry dressing prn

## 2014-03-12 ENCOUNTER — Encounter (HOSPITAL_COMMUNITY): Payer: Self-pay | Admitting: Orthopedic Surgery

## 2014-04-28 ENCOUNTER — Ambulatory Visit (INDEPENDENT_AMBULATORY_CARE_PROVIDER_SITE_OTHER): Payer: Medicare HMO | Admitting: Emergency Medicine

## 2014-04-28 VITALS — BP 122/73 | HR 63 | Temp 98.6°F | Resp 16

## 2014-04-28 DIAGNOSIS — Z7901 Long term (current) use of anticoagulants: Secondary | ICD-10-CM

## 2014-04-28 LAB — POCT CBC
Granulocyte percent: 61.6 %G (ref 37–80)
HCT, POC: 38.5 % (ref 37.7–47.9)
Hemoglobin: 12.4 g/dL (ref 12.2–16.2)
Lymph, poc: 2.5 (ref 0.6–3.4)
MCH: 32 pg — AB (ref 27–31.2)
MCHC: 32.4 g/dL (ref 31.8–35.4)
MCV: 99 fL — AB (ref 80–97)
MID (CBC): 0.4 (ref 0–0.9)
MPV: 8.4 fL (ref 0–99.8)
PLATELET COUNT, POC: 240 10*3/uL (ref 142–424)
POC Granulocyte: 4.7 (ref 2–6.9)
POC LYMPH %: 32.6 % (ref 10–50)
POC MID %: 5.8 %M (ref 0–12)
RBC: 3.88 M/uL — AB (ref 4.04–5.48)
RDW, POC: 14.5 %
WBC: 7.6 10*3/uL (ref 4.6–10.2)

## 2014-04-28 MED ORDER — WARFARIN SODIUM 7.5 MG PO TABS: 7.5000 mg | ORAL_TABLET | Freq: Every day | ORAL | Status: DC

## 2014-04-28 MED ORDER — CEPHALEXIN 500 MG PO CAPS: 500.0000 mg | ORAL_CAPSULE | Freq: Two times a day (BID) | ORAL | Status: DC
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
## 2014-04-29 LAB — PROTIME-INR
INR: 2.44 — ABNORMAL HIGH (ref <1.50)
Prothrombin Time: 26.5 seconds — ABNORMAL HIGH (ref 11.6–15.2)

## 2014-05-01 ENCOUNTER — Other Ambulatory Visit: Payer: Self-pay | Admitting: Radiology

## 2014-05-01 DIAGNOSIS — Z9229 Personal history of other drug therapy: Secondary | ICD-10-CM

## 2014-05-02 ENCOUNTER — Telehealth: Payer: Self-pay

## 2014-05-02 ENCOUNTER — Other Ambulatory Visit (INDEPENDENT_AMBULATORY_CARE_PROVIDER_SITE_OTHER): Payer: Medicare HMO | Admitting: *Deleted

## 2014-05-02 DIAGNOSIS — Z9229 Personal history of other drug therapy: Secondary | ICD-10-CM

## 2014-05-02 DIAGNOSIS — E785 Hyperlipidemia, unspecified: Secondary | ICD-10-CM

## 2014-05-02 DIAGNOSIS — D7589 Other specified diseases of blood and blood-forming organs: Secondary | ICD-10-CM

## 2014-05-02 LAB — HEPATIC FUNCTION PANEL
ALK PHOS: 103 U/L (ref 39–117)
ALT: 12 U/L (ref 0–35)
AST: 16 U/L (ref 0–37)
Albumin: 4.4 g/dL (ref 3.5–5.2)
BILIRUBIN DIRECT: 0.1 mg/dL (ref 0.0–0.3)
BILIRUBIN INDIRECT: 0.3 mg/dL (ref 0.2–1.2)
Total Bilirubin: 0.4 mg/dL (ref 0.2–1.2)
Total Protein: 6.8 g/dL (ref 6.0–8.3)

## 2014-05-02 LAB — LIPID PANEL
Cholesterol: 176 mg/dL (ref 0–200)
HDL: 53 mg/dL (ref >39)
LDL Cholesterol: 70 mg/dL (ref 0–99)
Total CHOL/HDL Ratio: 3.3 Ratio
Triglycerides: 267 mg/dL — ABNORMAL HIGH (ref <150)
VLDL: 53 mg/dL — AB (ref 0–40)

## 2014-05-02 LAB — CBC
HEMATOCRIT: 37.8 % (ref 36.0–46.0)
HEMOGLOBIN: 12.9 g/dL (ref 12.0–15.0)
MCH: 32.3 pg (ref 26.0–34.0)
MCHC: 34.1 g/dL (ref 30.0–36.0)
MCV: 94.7 fL (ref 78.0–100.0)
MPV: 10.6 fL (ref 9.4–12.4)
Platelets: 284 10*3/uL (ref 150–400)
RBC: 3.99 MIL/uL (ref 3.87–5.11)
RDW: 14.1 % (ref 11.5–15.5)
WBC: 8.2 10*3/uL (ref 4.0–10.5)

## 2014-05-02 NOTE — Progress Notes (Signed)
Pt here for lab draw only  

## 2014-05-02 NOTE — Telephone Encounter (Signed)
Pt states she needs to let Dr Cleta Albertsaub know that [facility] can do her Coumadin monitoring and will call/fax the results to him, She only needs faxed order from Dr Cleta Albertsaub stating frequency of monitoring    Best phone for pt is [phone]

## 2014-05-03 ENCOUNTER — Ambulatory Visit (INDEPENDENT_AMBULATORY_CARE_PROVIDER_SITE_OTHER): Payer: Commercial Managed Care - HMO | Admitting: Family Medicine

## 2014-05-03 VITALS — BP 112/58 | HR 69 | Temp 98.2°F | Resp 12 | Ht 61.5 in | Wt 107.6 lb

## 2014-05-03 DIAGNOSIS — Z7901 Long term (current) use of anticoagulants: Secondary | ICD-10-CM

## 2014-05-03 DIAGNOSIS — Z5181 Encounter for therapeutic drug level monitoring: Secondary | ICD-10-CM

## 2014-05-03 DIAGNOSIS — R300 Dysuria: Secondary | ICD-10-CM

## 2014-05-03 LAB — POCT UA - MICROSCOPIC ONLY
Casts, Ur, LPF, POC: NEGATIVE
Crystals, Ur, HPF, POC: NEGATIVE
Mucus, UA: NEGATIVE
Yeast, UA: NEGATIVE

## 2014-05-03 LAB — POCT URINALYSIS DIPSTICK
BILIRUBIN UA: NEGATIVE
GLUCOSE UA: NEGATIVE
Ketones, UA: 15
NITRITE UA: NEGATIVE
Protein, UA: 30
Spec Grav, UA: 1.02
UROBILINOGEN UA: 0.2
pH, UA: 6

## 2014-05-03 LAB — PROTIME-INR
INR: 3.34 — AB (ref <1.50)
PROTHROMBIN TIME: 33.9 s — AB (ref 11.6–15.2)

## 2014-05-03 MED ORDER — CEPHALEXIN 500 MG PO CAPS: 500.0000 mg | ORAL_CAPSULE | Freq: Three times a day (TID) | ORAL | Status: DC

## 2014-05-03 MED ORDER — PHENAZOPYRIDINE HCL 100 MG PO TABS: 100.0000 mg | ORAL_TABLET | Freq: Three times a day (TID) | ORAL | Status: DC | PRN

## 2014-05-03 NOTE — Telephone Encounter (Signed)
Spoke to patient, [facility] for [facility], number to call is [phone]. Patient understands dosing instructions on the Coumadin.(hold today and tomorrow, one half tablet on Saturday one tablet Sunday) I have spoken to nurse Terry Allison to give order, she will need level checked weekly until her dose is stable. Terry Allison has advised she will have this done, and I have given her my number to call results.

## 2014-05-03 NOTE — Patient Instructions (Signed)

## 2014-05-03 NOTE — Progress Notes (Signed)
Subjective:    Patient ID: Terry JakschElizabeth H Allison, female    DOB: 10-06-1945, 68 y.o.   MRN: [id number]  05/03/2014  Urinary Tract Infection   HPI This 68 y.o. female presents for evaluation of dysuria.  Onset this morning.  +dysuria.  +urgency. +frequency.  +hematuria. No fever/chills/sweats.  No abdominal pian.  No n/v.  No flank pain.  No vaginal discharge, vaginal irritation, vaginal pain.    HH attends.  INR 3.34 yesterday.  Holding Coumadin today and tomorrow. Just started Keflex today for leg cellulitis.  Was unable to start Keflex immediately after last visit.   Review of Systems  Constitutional: Negative for fever, chills, diaphoresis and fatigue.  Gastrointestinal: Negative for nausea, vomiting and abdominal pain.  Genitourinary: Positive for dysuria, urgency and frequency. Negative for hematuria, flank pain, vaginal bleeding, vaginal discharge, genital sores, vaginal pain and pelvic pain.  Hematological: Does not bruise/bleed easily.    Past Medical History  Diagnosis Date  . Allergy   . Asthma   . COPD (chronic obstructive pulmonary disease)   . Depression   . Neuromuscular disorder   . Thyroid disease   . Hypertension    Past Surgical History  Procedure Laterality Date  . Tubal ligation    . Spine surgery    . Tonsilectomy/adenoidectomy with myringotomy    . Orif tibia plateau Right 03/05/2014    Procedure: OPEN REDUCTION INTERNAL FIXATION (ORIF) TIBIAL PLATEAU;  Surgeon: Sheral Apleyimothy D Murphy, MD;  Location: [facility] OR;  Service: Orthopedics;  Laterality: Right;  . Fracture surgery     Allergies  Allergen Reactions  . Tramadol Nausea And Vomiting    Falling down   . Tramadol    Current Outpatient Prescriptions  Medication Sig Dispense Refill  . aspirin EC 81 MG tablet Take 81 mg by mouth daily.    Marland Kitchen. atorvastatin (LIPITOR) 40 MG tablet Take 1 tablet (40 mg total) by mouth daily. 90 tablet 2  . benazepril (LOTENSIN) 20 MG tablet Take 1 tablet (20 mg total) by mouth 2  (two) times daily. 180 tablet 3  . cephALEXin (KEFLEX) 500 MG capsule Take 1 capsule (500 mg total) by mouth 3 (three) times daily. 15 capsule 0  . fluticasone (FLONASE) 50 MCG/ACT nasal spray Place 2 sprays into both nostrils 2 (two) times daily.    Marland Kitchen. ipratropium (ATROVENT) 0.03 % nasal spray Place 2 sprays into the nose 4 (four) times daily. 30 mL 6  . levothyroxine (SYNTHROID, LEVOTHROID) 88 MCG tablet Take 88 mcg by mouth daily before breakfast.    . metoprolol (LOPRESSOR) 50 MG tablet Take 1 tablet (50 mg total) by mouth 2 (two) times daily. 180 tablet 3  . omeprazole (PRILOSEC) 20 MG capsule Take 20 mg by mouth daily.    Marland Kitchen. oxyCODONE (OXY IR/ROXICODONE) 5 MG immediate release tablet Take 1-3 tablets (5-15 mg total) by mouth every 4 (four) hours as needed (5mg  for mild pain, 10mg  for moderate pain, 15mg  for severe pain). 30 tablet 0  . polyethylene glycol (MIRALAX / GLYCOLAX) packet Take 17 g by mouth daily. 14 each 0  . warfarin (COUMADIN) 7.5 MG tablet Take 1 tablet (7.5 mg total) by mouth daily. 30 tablet 11  . phenazopyridine (PYRIDIUM) 100 MG tablet Take 1-2 tablets (100-200 mg total) by mouth 3 (three) times daily as needed for pain. 12 tablet 0   No current facility-administered medications for this visit.       Objective:    BP 112/58 mmHg  Pulse  69  Temp(Src) 98.2 F (36.8 C) (Oral)  Resp 12  Ht 5' 1.5" (1.562 m)  Wt 107 lb 9.6 oz (48.807 kg)  BMI 20.00 kg/m2  SpO2 96% Physical Exam  Constitutional: She is oriented to person, place, and time. She appears well-developed and well-nourished. No distress.  HENT:  Head: Normocephalic and atraumatic.  Eyes: Conjunctivae are normal. Pupils are equal, round, and reactive to light.  Neck: Normal range of motion. Neck supple.  Cardiovascular: Normal rate, regular rhythm and normal heart sounds.  Exam reveals no gallop and no friction rub.   No murmur heard. Pulmonary/Chest: Effort normal and breath sounds normal. She has no  wheezes. She has no rales.  Abdominal: Soft. Bowel sounds are normal. She exhibits no distension and no mass. There is tenderness in the suprapubic area. There is no rebound, no guarding and no CVA tenderness.  Musculoskeletal:  BLE in CAM walkers/casting/splinting material.  Neurological: She is alert and oriented to person, place, and time.  Skin: She is not diaphoretic.  Psychiatric: She has a normal mood and affect. Her behavior is normal.  Nursing note and vitals reviewed.       Assessment & Plan:   1. Dysuria   2. Anticoagulated on Coumadin     1.  Dysuria:  New.  Send urine culture yet has taken Keflex today so results may be affected.  Rx for Pyridium and Keflex provided.  RTC for fever, vomiting, flank pain.  Encourage hydration with water. 2. Coumadin therapy: stable; holding Coumadin for next 48 hours due to elevated INR.   Meds ordered this encounter  Medications  . phenazopyridine (PYRIDIUM) 100 MG tablet    Sig: Take 1-2 tablets (100-200 mg total) by mouth 3 (three) times daily as needed for pain.    Dispense:  12 tablet    Refill:  0  . cephALEXin (KEFLEX) 500 MG capsule    Sig: Take 1 capsule (500 mg total) by mouth 3 (three) times daily.    Dispense:  15 capsule    Refill:  0    No Follow-up on file.    Nilda SimmerKristi Lexy Meininger, M.D.  [facility]  [place] [address] [place], [name]  [id number] [phone] phone [phone]) [phone] fax

## 2014-05-05 LAB — URINE CULTURE: Colony Count: 1000

## 2014-05-06 ENCOUNTER — Encounter: Payer: Self-pay | Admitting: Family Medicine

## 2014-05-07 NOTE — Telephone Encounter (Signed)
INR is 1.6, report given to Dr Cleta Albertsaub, he has advised to alternate one tonight/ one half tomorrow one Wednesday one half on Thursday repeat INR on Friday. Gave order to nurse, expect call back on Friday.

## 2014-05-12 ENCOUNTER — Telehealth: Payer: Self-pay

## 2014-05-12 NOTE — Telephone Encounter (Signed)
Spoke with pt. Asked what her PT/INR was. She said the nurse did not bring out a machine, that the nurse drew her blood and sent it to the lab and said that the lab would call us with her results but unable to see where they had. Pt is going to call nurse and try to find out her results and then call me back.

## 2014-05-12 NOTE — Telephone Encounter (Signed)
Pt called back. She received the results.  PT: 31.4 INR: 3.0  These were drawn and sent to [facility].  Please advise on what she should do next. Pt is inquiring about taking Xarelto instead of Coumadin. Wants to know your thoughts on this.   Pt has not had Coumadin since Thurs. She took 1/2 tab on Thurs  They resulted these results to Dr. Rene PaciHussein at [facility]. She will see what she needs to do to change her doctor to use instead of him.

## 2014-05-12 NOTE — Telephone Encounter (Signed)
Dr. Cleta Albertsaub spoke with pt. He advised her to hold the coumadin today and to start on a 1/2 tab of the 7.5mg  qd starting tomorrow and to recheck Pt/INR on Thurs 05/17/14.  She will RTC at some point and discuss options about Xarelto with Dr. Cleta Albertsaub in clinic.  Can we send in an order to [facility] for this to be done on Monday please. I'm not sure I know how to do it. Thanks so much.

## 2014-05-12 NOTE — Telephone Encounter (Signed)
Patient is being monitored for INR -  She wants to know what warfarin (COUMADIN) 7.5 MG tablet Dosage she is to be taking.   Has not taken anything since Thursday.   Nurse came out yesterday to measure her levels.    [phone]

## 2014-05-14 NOTE — Telephone Encounter (Signed)
[facility] at [phone]  Spoke to [employer]eth gave VO for PT/INR. She will call with results.

## 2014-05-17 ENCOUNTER — Telehealth: Payer: Self-pay | Admitting: Radiology

## 2014-05-17 NOTE — Telephone Encounter (Signed)
Patient INR is 1.7, per nurse with [facility]. Dr Eulah PontMurphy has advised to d/c coumadin. Per Dr Cleta Albertsaub okay to D/C and start taking Aspirin 325 (coated). Nurse has advised patient and she voiced understanding. Phone call to Dr Eulah PontMurphy Marcial Pacas(Timothy) was placed to let him know of this plan. Message to Dr Cleta Albertsaub, Dr Eulah PontMurphy will call you on your cell.

## 2014-07-22 ENCOUNTER — Ambulatory Visit (INDEPENDENT_AMBULATORY_CARE_PROVIDER_SITE_OTHER): Payer: Medicare Other | Admitting: Emergency Medicine

## 2014-07-22 VITALS — BP 190/90 | HR 64 | Temp 98.4°F | Resp 18 | Ht 60.0 in | Wt 109.8 lb

## 2014-07-22 DIAGNOSIS — S129XXS Fracture of neck, unspecified, sequela: Secondary | ICD-10-CM
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
## 2014-09-03 ENCOUNTER — Other Ambulatory Visit: Payer: Self-pay | Admitting: Family Medicine

## 2014-09-15 ENCOUNTER — Other Ambulatory Visit: Payer: Self-pay | Admitting: Family Medicine

## 2014-09-29 ENCOUNTER — Other Ambulatory Visit: Payer: Self-pay | Admitting: Family Medicine

## 2014-10-03 ENCOUNTER — Other Ambulatory Visit: Payer: Self-pay | Admitting: Family Medicine

## 2014-10-06 ENCOUNTER — Other Ambulatory Visit: Payer: Self-pay | Admitting: Family Medicine

## 2014-12-15 ENCOUNTER — Other Ambulatory Visit: Payer: Self-pay | Admitting: Emergency Medicine

## 2015-01-02 ENCOUNTER — Ambulatory Visit (INDEPENDENT_AMBULATORY_CARE_PROVIDER_SITE_OTHER): Payer: Medicare Other | Admitting: Emergency Medicine

## 2015-01-02 VITALS — BP 170/92 | HR 48 | Temp 98.2°F | Resp 18 | Ht 60.0 in | Wt 114.8 lb

## 2015-01-02 DIAGNOSIS — M5489 Other dorsalgia: Secondary | ICD-10-CM | POA: Diagnosis not present

## 2015-01-02 DIAGNOSIS — E785 Hyperlipidemia, unspecified: Secondary | ICD-10-CM | POA: Diagnosis not present

## 2015-01-02 DIAGNOSIS — I10 Essential (primary) hypertension: Secondary | ICD-10-CM

## 2015-01-02 DIAGNOSIS — E038 Other specified hypothyroidism: Secondary | ICD-10-CM

## 2015-01-02 LAB — POCT CBC
Granulocyte percent: 66 %G (ref 37–80)
HEMATOCRIT: 39.7 % (ref 37.7–47.9)
Hemoglobin: 12.9 g/dL (ref 12.2–16.2)
LYMPH, POC: 1.5 (ref 0.6–3.4)
MCH, POC: 34 pg — AB (ref 27–31.2)
MCHC: 32.5 g/dL (ref 31.8–35.4)
MCV: 104.4 fL — AB (ref 80–97)
MID (CBC): 0.5 (ref 0–0.9)
MPV: 7.1 fL (ref 0–99.8)
POC Granulocyte: 3.8 (ref 2–6.9)
POC LYMPH %: 25.7 % (ref 10–50)
POC MID %: 8.3 % (ref 0–12)
Platelet Count, POC: 183 10*3/uL (ref 142–424)
RBC: 3.8 M/uL — AB (ref 4.04–5.48)
RDW, POC: 16.4 %
WBC: 5.7 10*3/uL (ref 4.6–10.2)

## 2015-01-02 LAB — TSH: TSH: 59.046 u[IU]/mL — ABNORMAL HIGH (ref 0.350–4.500)

## 2015-01-02 LAB — T4, FREE: Free T4: 0.41 ng/dL — ABNORMAL LOW (ref 0.80–1.80)

## 2015-01-02 MED ORDER — LEVOTHYROXINE SODIUM 88 MCG PO TABS: 88.0000 ug | ORAL_TABLET | Freq: Every day | ORAL | Status: DC

## 2015-01-02 MED ORDER — HYDROCHLOROTHIAZIDE 12.5 MG PO CAPS: 12.5000 mg | ORAL_CAPSULE | Freq: Every day | ORAL | Status: DC

## 2015-01-02 MED ORDER — BENAZEPRIL HCL 20 MG PO TABS: 20.0000 mg | ORAL_TABLET | Freq: Two times a day (BID) | ORAL | Status: DC

## 2015-01-02 MED ORDER — ATORVASTATIN CALCIUM 40 MG PO TABS: ORAL_TABLET | ORAL | Status: DC

## 2015-01-02 MED ORDER — METOPROLOL TARTRATE 50 MG PO TABS: 50.0000 mg | ORAL_TABLET | Freq: Two times a day (BID) | ORAL | Status: DC

## 2015-01-02 MED ORDER — HYDROCODONE-ACETAMINOPHEN 5-325 MG PO TABS: 1.0000 | ORAL_TABLET | Freq: Four times a day (QID) | ORAL | Status: DC | PRN

## 2015-01-02 NOTE — Progress Notes (Addendum)
Patient ID: Terry Allison, female   DOB: Jul 22, 1945, 69 y.o.   MRN: [id number]    This chart was scribed for Earl Lites, MD by [facility], medical scribe at [facility].The patient was seen in exam room 06 and the patient's care was started at 10:11 AM.  Chief Complaint:  Chief Complaint  Patient presents with  . Medication Refill    atrovastatin, flonase, levothyroxine, oxycodone   . Labs Only    for medication    HPI: Terry Allison is a 69 y.o. female who reports to [facility] today for a medication refill. She needs levothyroxine, atorvastatin, flonase, and oxycodone refilled. Dr. Eulah Pont writes her pain medication, she has been ok until about a week ago. She developed pain in her lower back. She has an appointment next month with Dr. Ilean Skill for her neck. She has not been taking her pain medication, but does take alleve. Pt is also here for lab work. Blood pressure recheck is 180/80 on left arm, and 180/80 on right. Appointment next month with a new PCP.  Past Medical History  Diagnosis Date  . Allergy   . Asthma   . COPD (chronic obstructive pulmonary disease)   . Depression   . Neuromuscular disorder   . Thyroid disease   . Hypertension    Past Surgical History  Procedure Laterality Date  . Tubal ligation    . Spine surgery    . Tonsilectomy/adenoidectomy with myringotomy    . Orif tibia plateau Right 03/05/2014    Procedure: OPEN REDUCTION INTERNAL FIXATION (ORIF) TIBIAL PLATEAU;  Surgeon: Sheral Apley, MD;  Location: [facility] OR;  Service: Orthopedics;  Laterality: Right;  . Fracture surgery     History   Social History  . Marital Status: Single    Spouse Name: N/A  . Number of Children: N/A  . Years of Education: N/A   Social History Main Topics  . Smoking status: Former Smoker    Quit date: 07/22/1996  . Smokeless tobacco: Never Used  . Alcohol Use: 0.0 oz/week    0 Standard drinks or equivalent per week     Comment: daily 3-4 glasses  of wine  . Drug Use: No  . Sexual Activity: Not on file   Other Topics Concern  . None   Social History Narrative   ** Merged History Encounter **       Family History  Problem Relation Age of Onset  . Heart disease Father   . Mental illness Father   . Cancer Sister   . Hyperlipidemia Sister    Allergies  Allergen Reactions  . Tramadol Nausea And Vomiting    Falling down   . Tramadol    Prior to Admission medications   Medication Sig Start Date End Date Taking? Authorizing Provider  aspirin 325 MG EC tablet Take 325 mg by mouth daily.   Yes Historical Provider, MD  atorvastatin (LIPITOR) 40 MG tablet TAKE 1 TABLET BY MOUTH EVERY DAY.  "OV NEEDED FOR ADDITIONAL REFILLS" 2ND FASTING/LABS 10/03/14  Yes Chelle Jeffery, PA-C  atorvastatin (LIPITOR) 40 MG tablet TAKE 1 TABLET BY MOUTH EVERY DAY.  "OV NEEDED FOR FURTHER REFILLS" 10/07/14  Yes Chelle Jeffery, PA-C  benazepril (LOTENSIN) 20 MG tablet Take 1 tablet (20 mg total) by mouth 2 (two) times daily. PATIENT NEEDS BLOOD PRESSURE CHECK UP FOR ADDITIONAL REFILLS 12/17/14  Yes Collene Gobble, MD  fluticasone (FLONASE) 50 MCG/ACT nasal spray Place 2 sprays into both  nostrils 2 (two) times daily.   Yes Historical Provider, MD  ipratropium (ATROVENT) 0.03 % nasal spray Place 2 sprays into the nose 4 (four) times daily. 07/11/13  Yes Gwenlyn Found Copland, MD  levothyroxine (SYNTHROID, LEVOTHROID) 88 MCG tablet Take 88 mcg by mouth daily before breakfast.   Yes Historical Provider, MD  metoprolol (LOPRESSOR) 50 MG tablet Take 1 tablet (50 mg total) by mouth 2 (two) times daily. PATIENT NEEDS BLOOD PRESSURE CHECK UP FOR ADDITIONAL REFILLS 12/17/14  Yes Collene Gobble, MD  omeprazole (PRILOSEC) 20 MG capsule Take 20 mg by mouth daily.   Yes Historical Provider, MD  oxyCODONE (OXY IR/ROXICODONE) 5 MG immediate release tablet Take 1-3 tablets (5-15 mg total) by mouth every 4 (four) hours as needed (5mg  for mild pain, 10mg  for moderate pain, 15mg  for severe  pain). 03/09/14  Yes Emina Riebock, NP  methylPREDNISolone (MEDROL DOSEPAK) 4 MG tablet Take 4 mg by mouth. follow package directions    Historical Provider, MD     ROS: The patient denies fevers, chills, night sweats, unintentional weight loss, chest pain, palpitations, wheezing, dyspnea on exertion, nausea, vomiting, abdominal pain, dysuria, hematuria, melena, numbness, weakness, or tingling.  All other systems have been reviewed and were otherwise negative with the exception of those mentioned in the HPI and as above.    PHYSICAL EXAM: Filed Vitals:   01/02/15 0913  BP: 170/92  Pulse: 48  Temp: 98.2 F (36.8 C)  Resp: 18   Body mass index is 22.42 kg/(m^2).  General: Alert, no acute distress HEENT:  Normocephalic, atraumatic, oropharynx patent. Eye: Nonie Hoyer [facility] Cardiovascular:  Regular rate and rhythm, no rubs murmurs or gallops.  No Carotid bruits, radial pulse intact. No pedal edema.  Diminished pulses of the lower extremity of 1+ Respiratory: Clear to auscultation bilaterally.  No wheezes, rales, or rhonchi.  No cyanosis, no use of accessory musculature Musculoskeletal: Gait intact. No edema, tender over lower lumbar spine Skin: No rashes. Scar over right knee Neurologic: Facial musculature symmetric. Psychiatric: Patient acts appropriately throughout our interaction.  LABS: Results for orders placed or performed in visit on 05/03/14  Urine culture  Result Value Ref Range   Colony Count 1,000 COLONIES/ML    Organism ID, Bacteria Insignificant Growth   POCT UA - Microscopic Only  Result Value Ref Range   WBC, Ur, HPF, POC tntc    RBC, urine, microscopic 50-60    Bacteria, U Microscopic trace    Mucus, UA neg    Epithelial cells, urine per micros 0-2    Crystals, Ur, HPF, POC neg    Casts, Ur, LPF, POC neg    Yeast, UA neg   POCT urinalysis dipstick  Result Value Ref Range   Color, UA yellow    Clarity, UA cloudy    Glucose, UA neg    Bilirubin, UA neg     Ketones, UA 15    Spec Grav, UA 1.020    Blood, UA large    pH, UA 6.0    Protein, UA 30    Urobilinogen, UA 0.2    Nitrite, UA neg    Leukocytes, UA moderate (2+)    EKG/XRAY:   Primary read interpreted by [facility]. Cleta Alberts at [facility]. Meds ordered this encounter  Medications  . HYDROcodone-acetaminophen (NORCO) 5-325 MG per tablet    Sig: Take 1 tablet by mouth every 6 (six) hours as needed for moderate pain.    Dispense:  30 tablet    Refill:  0  .  atorvastatin (LIPITOR) 40 MG tablet    Sig: TAKE 1 TABLET BY MOUTH EVERY DAY.    Dispense:  30 tablet    Refill:  11  . benazepril (LOTENSIN) 20 MG tablet    Sig: Take 1 tablet (20 mg total) by mouth 2 (two) times daily. PATIENT NEEDS BLOOD PRESSURE CHECK UP FOR ADDITIONAL REFILLS    Dispense:  60 tablet    Refill:  0  . metoprolol (LOPRESSOR) 50 MG tablet    Sig: Take 1 tablet (50 mg total) by mouth 2 (two) times daily. PATIENT NEEDS BLOOD PRESSURE CHECK UP FOR ADDITIONAL REFILLS    Dispense:  60 tablet    Refill:  0  . levothyroxine (SYNTHROID, LEVOTHROID) 88 MCG tablet    Sig: Take 1 tablet (88 mcg total) by mouth daily before breakfast.    Dispense:  30 tablet    Refill:  11  . hydrochlorothiazide (MICROZIDE) 12.5 MG capsule    Sig: Take 1 capsule (12.5 mg total) by mouth daily.    Dispense:  30 capsule    Refill:  11    ASSESSMENT/PLAN: I added HCTZ 12.5 because blood pressure is not at goal. Her other medications were refilled. She was given hydrocodone to take for her back pain until she can get in to see Dr. Danielle Dess. She was advised to hold off on non-steroidal's because of her blood pressure elevation.I personally performed the services described in this documentation, which was scribed in my presence. The recorded information has been reviewed and is accurate. I did pull her drug reporting sheet with the state and there was no unusual activity.  Earl Lites, MD  Gross sideeffects, risk and benefits, and alternatives of  medications d/w patient. Patient is aware that all medications have potential sideeffects and we are unable to predict every sideeffect or drug-drug interaction that may occur.    Lesle Chris MD 01/02/2015 10:11 AM

## 2015-01-03 ENCOUNTER — Encounter: Payer: Self-pay | Admitting: Family Medicine

## 2015-01-03 LAB — COMPREHENSIVE METABOLIC PANEL
ALK PHOS: 66 U/L (ref 33–130)
ALT: 16 U/L (ref 6–29)
AST: 34 U/L (ref 10–35)
Albumin: 4.1 g/dL (ref 3.6–5.1)
BUN: 19 mg/dL (ref 7–25)
CO2: 24 mmol/L (ref 20–31)
Calcium: 9.6 mg/dL (ref 8.6–10.4)
Chloride: 104 mmol/L (ref 98–110)
Creat: 0.9 mg/dL (ref 0.50–0.99)
Glucose, Bld: 85 mg/dL (ref 65–99)
Potassium: 5.4 mmol/L — ABNORMAL HIGH (ref 3.5–5.3)
SODIUM: 141 mmol/L (ref 135–146)
Total Bilirubin: 0.8 mg/dL (ref 0.2–1.2)
Total Protein: 6.7 g/dL (ref 6.1–8.1)

## 2015-01-03 LAB — LIPID PANEL
Cholesterol: 339 mg/dL — ABNORMAL HIGH (ref 125–200)
HDL: 74 mg/dL (ref >=46)
TRIGLYCERIDES: 558 mg/dL — AB (ref <150)
Total CHOL/HDL Ratio: 4.6 Ratio (ref <=5.0)

## 2015-02-02 ENCOUNTER — Ambulatory Visit (INDEPENDENT_AMBULATORY_CARE_PROVIDER_SITE_OTHER): Payer: Medicare Other | Admitting: Emergency Medicine

## 2015-02-02 DIAGNOSIS — D7589 Other specified diseases of blood and blood-forming organs: Secondary | ICD-10-CM | POA: Diagnosis not present

## 2015-02-02 DIAGNOSIS — I10 Essential (primary) hypertension: Secondary | ICD-10-CM

## 2015-02-02 DIAGNOSIS — E039 Hypothyroidism, unspecified: Secondary | ICD-10-CM | POA: Diagnosis not present

## 2015-02-02 DIAGNOSIS — E785 Hyperlipidemia, unspecified: Secondary | ICD-10-CM

## 2015-02-02 DIAGNOSIS — Z23 Encounter for immunization: Secondary | ICD-10-CM

## 2015-02-02 DIAGNOSIS — E875 Hyperkalemia: Secondary | ICD-10-CM | POA: Diagnosis not present

## 2015-02-02 MED ORDER — METOPROLOL TARTRATE 50 MG PO TABS: ORAL_TABLET | ORAL | Status: DC

## 2015-02-02 MED ORDER — FLUTICASONE PROPIONATE 50 MCG/ACT NA SUSP: 2.0000 | Freq: Every day | NASAL | Status: DC

## 2015-02-02 MED ORDER — BENAZEPRIL HCL 20 MG PO TABS: ORAL_TABLET | ORAL | Status: DC

## 2015-02-02 NOTE — Patient Instructions (Signed)

## 2015-02-02 NOTE — Progress Notes (Addendum)
Patient ID: Terry Allison, female   DOB: 07/30/1945, 69 y.o.   MRN: [id number]    This chart was scribed for Collene Gobble, MD by Charline Bills, ED Scribe. The patient was seen in room 8. Patient's care was started at 3:26 PM.  Chief Complaint:  Chief Complaint  Patient presents with  . Medication Refill    lotensin,flonase,lopressor    HPI: Terry Allison is a 69 y.o. female, with a h/o HTN, who reports to [facility] today for a medication refill of lotensin, flonase and lopressor. Pt was last seen by me on 01/02/15 with a BP of 170/92, cholesterol of 339, potassium was elevated at 5.4, T4 of .41 with a TSH of 59. She states that she had been out of her thyroid medications for 1 month but she was taking Lipitor at that time. Today, her BP during examination on the right is 146/84.   Back Pain Pt reports that she is no longer having upper back pain. Now, she is experiencing lower back pain. During the last visit, pt was advised to follow up with spine specialist Dr. Danielle Dess. She has not been seen by him yet, but she has an upcoming appointment on 02/20/15 which she may postpone.   Immunizations Pt has not had a flu vaccine yet and plans to receive it today.   Past Medical History  Diagnosis Date  . Allergy   . Asthma   . COPD (chronic obstructive pulmonary disease)   . Depression   . Neuromuscular disorder   . Thyroid disease   . Hypertension    Past Surgical History  Procedure Laterality Date  . Tubal ligation    . Spine surgery    . Tonsilectomy/adenoidectomy with myringotomy    . Orif tibia plateau Right 03/05/2014    Procedure: OPEN REDUCTION INTERNAL FIXATION (ORIF) TIBIAL PLATEAU;  Surgeon: Sheral Apley, MD;  Location: [facility] OR;  Service: Orthopedics;  Laterality: Right;  . Fracture surgery     Social History   Social History  . Marital Status: Single    Spouse Name: N/A  . Number of Children: N/A  . Years of Education: N/A   Social History Main Topics  .  Smoking status: Former Smoker    Quit date: 07/22/1996  . Smokeless tobacco: Never Used  . Alcohol Use: 0.0 oz/week    0 Standard drinks or equivalent per week     Comment: daily 3-4 glasses of wine  . Drug Use: No  . Sexual Activity: Not on file   Other Topics Concern  . Not on file   Social History Narrative   ** Merged History Encounter **       Family History  Problem Relation Age of Onset  . Heart disease Father   . Mental illness Father   . Cancer Sister   . Hyperlipidemia Sister    Allergies  Allergen Reactions  . Tramadol Nausea And Vomiting    Falling down   . Tramadol    Prior to Admission medications   Medication Sig Start Date End Date Taking? Authorizing Provider  aspirin 325 MG EC tablet Take 325 mg by mouth daily.   Yes Historical Provider, MD  atorvastatin (LIPITOR) 40 MG tablet TAKE 1 TABLET BY MOUTH EVERY DAY.  "OV NEEDED FOR FURTHER REFILLS" 10/07/14  Yes Chelle Jeffery, PA-C  atorvastatin (LIPITOR) 40 MG tablet TAKE 1 TABLET BY MOUTH EVERY DAY. 01/02/15  Yes Collene Gobble, MD  benazepril (LOTENSIN) 20 MG tablet  Take 1 tablet (20 mg total) by mouth 2 (two) times daily. PATIENT NEEDS BLOOD PRESSURE CHECK UP FOR ADDITIONAL REFILLS 01/02/15  Yes Collene Gobble, MD  hydrochlorothiazide (MICROZIDE) 12.5 MG capsule Take 1 capsule (12.5 mg total) by mouth daily. 01/02/15  Yes Collene Gobble, MD  HYDROcodone-acetaminophen (NORCO) 5-325 MG per tablet Take 1 tablet by mouth every 6 (six) hours as needed for moderate pain. 01/02/15  Yes Collene Gobble, MD  ipratropium (ATROVENT) 0.03 % nasal spray Place 2 sprays into the nose 4 (four) times daily. 07/11/13  Yes Gwenlyn Found Copland, MD  levothyroxine (SYNTHROID, LEVOTHROID) 88 MCG tablet Take 1 tablet (88 mcg total) by mouth daily before breakfast. 01/02/15  Yes Collene Gobble, MD  metoprolol (LOPRESSOR) 50 MG tablet Take 1 tablet (50 mg total) by mouth 2 (two) times daily. PATIENT NEEDS BLOOD PRESSURE CHECK UP FOR ADDITIONAL REFILLS  01/02/15  Yes Collene Gobble, MD  omeprazole (PRILOSEC) 20 MG capsule Take 20 mg by mouth daily.   Yes Historical Provider, MD  oxyCODONE (OXY IR/ROXICODONE) 5 MG immediate release tablet Take 1-3 tablets (5-15 mg total) by mouth every 4 (four) hours as needed (5mg  for mild pain, 10mg  for moderate pain, 15mg  for severe pain). 03/09/14  Yes Emina Riebock, NP  fluticasone (FLONASE) 50 MCG/ACT nasal spray Place 2 sprays into both nostrils 2 (two) times daily.    Historical Provider, MD  methylPREDNISolone (MEDROL DOSEPAK) 4 MG tablet Take 4 mg by mouth. follow package directions    Historical Provider, MD     ROS: The patient denies fevers, chills, night sweats, unintentional weight loss, chest pain, palpitations, wheezing, dyspnea on exertion, nausea, vomiting, abdominal pain, dysuria, hematuria, melena, numbness, weakness, or tingling. + back pain  All other systems have been reviewed and were otherwise negative with the exception of those mentioned in the HPI and as above.    PHYSICAL EXAM: There were no vitals filed for this visit. There is no weight on file to calculate BMI.   General: Alert, no acute distress HEENT:  Normocephalic, atraumatic, oropharynx patent. Eye: Nonie Hoyer [facility] Cardiovascular:  Regular rate and rhythm, no rubs murmurs or gallops.  No Carotid bruits, radial pulse intact. No pedal edema.  Respiratory: Clear to auscultation bilaterally.  No wheezes, rales, or rhonchi.  No cyanosis, no use of accessory musculature Abdominal: No organomegaly, abdomen is soft and non-tender, positive bowel sounds.  No masses. Musculoskeletal: Gait intact. No edema, tenderness Skin: No rashes. Neurologic: Facial musculature symmetric. Psychiatric: Patient acts appropriately throughout our interaction. Lymphatic: No cervical or submandibular lymphadenopathy Genitourinary/Anorectal: No acute findings   LABS: Results for orders placed or performed in visit on 01/02/15  Comprehensive  metabolic panel  Result Value Ref Range   Sodium 141 135 - 146 mmol/L   Potassium 5.4 (H) 3.5 - 5.3 mmol/L   Chloride 104 98 - 110 mmol/L   CO2 24 20 - 31 mmol/L   Glucose, Bld 85 65 - 99 mg/dL   BUN 19 7 - 25 mg/dL   Creat 1.61 0.96 - 0.45 mg/dL   Total Bilirubin 0.8 0.2 - 1.2 mg/dL   Alkaline Phosphatase 66 33 - 130 U/L   AST 34 10 - 35 U/L   ALT 16 6 - 29 U/L   Total Protein 6.7 6.1 - 8.1 g/dL   Albumin 4.1 3.6 - 5.1 g/dL   Calcium 9.6 8.6 - 40.9 mg/dL  T4, free  Result Value Ref Range   Free T4 0.41 (  L) 0.80 - 1.80 ng/dL  TSH  Result Value Ref Range   TSH 59.046 (H) 0.350 - 4.500 uIU/mL  Lipid panel  Result Value Ref Range   Cholesterol 339 (H) 125 - 200 mg/dL   Triglycerides 960 (H) <150 mg/dL   HDL 74 >=45 mg/dL   Total CHOL/HDL Ratio 4.6 <=5.0 Ratio   VLDL NOT CALC <30 mg/dL   LDL Cholesterol NOT CALC <130 mg/dL  POCT CBC  Result Value Ref Range   WBC 5.7 4.6 - 10.2 K/uL   Lymph, poc 1.5 0.6 - 3.4   POC LYMPH PERCENT 25.7 10 - 50 %L   MID (cbc) 0.5 0 - 0.9   POC MID % 8.3 0 - 12 %M   POC Granulocyte 3.8 2 - 6.9   Granulocyte percent 66.0 37 - 80 %G   RBC 3.80 (A) 4.04 - 5.48 M/uL   Hemoglobin 12.9 12.2 - 16.2 g/dL   HCT, POC 40.9 81.1 - 47.9 %   MCV 104.4 (A) 80 - 97 fL   MCH, POC 34.0 (A) 27 - 31.2 pg   MCHC 32.5 31.8 - 35.4 g/dL   RDW, POC 91.4 %   Platelet Count, POC 183 142 - 424 K/uL   MPV 7.1 0 - 99.8 fL   Meds ordered this encounter  Medications  . benazepril (LOTENSIN) 20 MG tablet    Sig: 1 tablet twice a day    Dispense:  60 tablet    Refill:  11  . metoprolol (LOPRESSOR) 50 MG tablet    Sig: Take 1 tablet twice daily    Dispense:  60 tablet    Refill:  11  . fluticasone (FLONASE) 50 MCG/ACT nasal spray    Sig: Place 2 sprays into both nostrils daily.    Dispense:  16 g    Refill:  11    EKG/XRAY:   Primary read interpreted by Dr. Cleta Alberts at [facility].   ASSESSMENT/PLAN: 1. Essential hypertension  - BASIC METABOLIC PANEL WITH GFR -  benazepril (LOTENSIN) 20 MG tablet; 1 tablet twice a day  Dispense: 60 tablet; Refill: 11 - metoprolol (LOPRESSOR) 50 MG tablet; Take 1 tablet twice daily  Dispense: 60 tablet; Refill:   2. Hyperlipidemia She states she will take her statin medication as prescribed. - Lipid panel  3. Hypothyroidism, unspecified hypothyroidism type She states she had stopped her thyroid medication for approximately 1 month. She is now back on thyroid medication. - TSH  4. Macrocytosis without anemia  - Vitamin B12  5. Hyperkalemia  - BASIC METABOLIC PANEL WITH GFR  6. Need for immunization against influenza  - Flu Vaccine QUAD 36+ mos IM   I personally performed the services described in this documentation, which was scribed in my presence. The recorded information has been reviewed and is accurate.  Lesle Chris, MD  [facility], [facility]  02/02/2015 5:01 PM   Gross sideeffects, risk and benefits, and alternatives of medications d/w patient. Patient is aware that all medications have potential sideeffects and we are unable to predict every sideeffect or drug-drug interaction that may occur.  Lesle Chris MD 02/02/2015 3:26 PM

## 2015-02-03 ENCOUNTER — Other Ambulatory Visit: Payer: Self-pay | Admitting: Emergency Medicine

## 2015-02-03 LAB — BASIC METABOLIC PANEL WITH GFR
BUN: 29 mg/dL — ABNORMAL HIGH (ref 7–25)
CALCIUM: 9.6 mg/dL (ref 8.6–10.4)
CO2: 25 mmol/L (ref 20–31)
CREATININE: 1.18 mg/dL — AB (ref 0.50–0.99)
Chloride: 101 mmol/L (ref 98–110)
GFR, Est African American: 55 mL/min — ABNORMAL LOW (ref >=60)
GFR, Est Non African American: 48 mL/min — ABNORMAL LOW (ref >=60)
Glucose, Bld: 105 mg/dL — ABNORMAL HIGH (ref 65–99)
Potassium: 4.7 mmol/L (ref 3.5–5.3)
Sodium: 136 mmol/L (ref 135–146)

## 2015-02-03 LAB — TSH: TSH: 0.114 u[IU]/mL — ABNORMAL LOW (ref 0.350–4.500)

## 2015-02-03 LAB — LIPID PANEL
Cholesterol: 189 mg/dL (ref 125–200)
HDL: 74 mg/dL (ref >=46)
LDL CALC: 83 mg/dL (ref <130)
Total CHOL/HDL Ratio: 2.6 Ratio (ref <=5.0)
Triglycerides: 161 mg/dL — ABNORMAL HIGH (ref <150)
VLDL: 32 mg/dL — ABNORMAL HIGH (ref <30)

## 2015-02-03 LAB — VITAMIN B12: Vitamin B-12: 399 pg/mL (ref 211–911)

## 2015-02-05 ENCOUNTER — Other Ambulatory Visit: Payer: Self-pay | Admitting: Family Medicine

## 2015-02-05 MED ORDER — LEVOTHYROXINE SODIUM 75 MCG PO TABS: 75.0000 ug | ORAL_TABLET | Freq: Every day | ORAL | Status: DC

## 2015-02-08 ENCOUNTER — Other Ambulatory Visit: Payer: Self-pay

## 2015-02-08 DIAGNOSIS — E785 Hyperlipidemia, unspecified: Secondary | ICD-10-CM

## 2015-02-08 DIAGNOSIS — I10 Essential (primary) hypertension: Secondary | ICD-10-CM

## 2015-02-08 MED ORDER — ATORVASTATIN CALCIUM 40 MG PO TABS: ORAL_TABLET | ORAL | Status: DC

## 2015-02-08 MED ORDER — HYDROCHLOROTHIAZIDE 12.5 MG PO CAPS: 12.5000 mg | ORAL_CAPSULE | Freq: Every day | ORAL | Status: DC

## 2015-02-08 MED ORDER — BENAZEPRIL HCL 20 MG PO TABS: ORAL_TABLET | ORAL | Status: DC

## 2015-02-08 MED ORDER — METOPROLOL TARTRATE 50 MG PO TABS: ORAL_TABLET | ORAL | Status: DC

## 2015-02-08 NOTE — Telephone Encounter (Signed)
Pharm sent reqs for change to 90 day refills. Re-sent Rxs as 90 day.

## 2015-02-12 ENCOUNTER — Ambulatory Visit: Payer: Self-pay | Admitting: Family Medicine

## 2016-02-01 ENCOUNTER — Other Ambulatory Visit: Payer: Self-pay | Admitting: Emergency Medicine

## 2016-02-01 DIAGNOSIS — I10 Essential (primary) hypertension: Secondary | ICD-10-CM

## 2016-03-05 ENCOUNTER — Other Ambulatory Visit: Payer: Self-pay | Admitting: Emergency Medicine

## 2016-03-05 DIAGNOSIS — I10 Essential (primary) hypertension: Secondary | ICD-10-CM

## 2016-03-14 ENCOUNTER — Other Ambulatory Visit: Payer: Self-pay | Admitting: Emergency Medicine

## 2016-03-14 DIAGNOSIS — I10 Essential (primary) hypertension: Secondary | ICD-10-CM

## 2016-03-14 DIAGNOSIS — E785 Hyperlipidemia, unspecified: Secondary | ICD-10-CM

## 2016-03-29 ENCOUNTER — Emergency Department (HOSPITAL_COMMUNITY)
Admission: EM | Admit: 2016-03-29 | Discharge: 2016-03-30 | Disposition: A | Discharge status: Home / Self Care | Payer: Medicare Other | Attending: Emergency Medicine | Admitting: Emergency Medicine

## 2016-03-29 ENCOUNTER — Emergency Department (HOSPITAL_COMMUNITY): Payer: Medicare Other

## 2016-03-29 ENCOUNTER — Encounter (HOSPITAL_COMMUNITY): Payer: Self-pay | Admitting: Emergency Medicine

## 2016-03-29 DIAGNOSIS — Y9289 Other specified places as the place of occurrence of the external cause: Secondary | ICD-10-CM | POA: Diagnosis not present

## 2016-03-29 DIAGNOSIS — R51 Headache: Secondary | ICD-10-CM | POA: Insufficient documentation

## 2016-03-29 DIAGNOSIS — S0083XA Contusion of other part of head, initial encounter: Secondary | ICD-10-CM

## 2016-03-29 DIAGNOSIS — X58XXXA Exposure to other specified factors, initial encounter: Secondary | ICD-10-CM | POA: Diagnosis not present

## 2016-03-29 DIAGNOSIS — Z7982 Long term (current) use of aspirin: Secondary | ICD-10-CM | POA: Diagnosis not present

## 2016-03-29 DIAGNOSIS — J449 Chronic obstructive pulmonary disease, unspecified: Secondary | ICD-10-CM | POA: Diagnosis not present

## 2016-03-29 DIAGNOSIS — Y939 Activity, unspecified: Secondary | ICD-10-CM | POA: Diagnosis not present

## 2016-03-29 DIAGNOSIS — Z87891 Personal history of nicotine dependence: Secondary | ICD-10-CM | POA: Insufficient documentation

## 2016-03-29 DIAGNOSIS — Y990 Civilian activity done for income or pay: Secondary | ICD-10-CM | POA: Insufficient documentation

## 2016-03-29 DIAGNOSIS — I10 Essential (primary) hypertension: Secondary | ICD-10-CM | POA: Insufficient documentation

## 2016-03-29 DIAGNOSIS — R040 Epistaxis: Secondary | ICD-10-CM | POA: Insufficient documentation

## 2016-03-29 DIAGNOSIS — Z23 Encounter for immunization: Secondary | ICD-10-CM | POA: Insufficient documentation

## 2016-03-29 DIAGNOSIS — E039 Hypothyroidism, unspecified: Secondary | ICD-10-CM | POA: Insufficient documentation

## 2016-03-29 DIAGNOSIS — S0031XA Abrasion of nose, initial encounter: Secondary | ICD-10-CM | POA: Insufficient documentation

## 2016-03-29 LAB — BASIC METABOLIC PANEL
Anion gap: 12 (ref 5–15)
BUN: 17 mg/dL (ref 6–20)
CALCIUM: 9.3 mg/dL (ref 8.9–10.3)
CO2: 18 mmol/L — ABNORMAL LOW (ref 22–32)
CREATININE: 1.06 mg/dL — AB (ref 0.44–1.00)
Chloride: 105 mmol/L (ref 101–111)
GFR calc Af Amer: >60 mL/min (ref >60)
GFR, EST NON AFRICAN AMERICAN: 53 mL/min — AB (ref >60)
GLUCOSE: 111 mg/dL — AB (ref 65–99)
POTASSIUM: 3.8 mmol/L (ref 3.5–5.1)
SODIUM: 135 mmol/L (ref 135–145)

## 2016-03-29 LAB — CBC
HCT: 36.6 % (ref 36.0–46.0)
Hemoglobin: 12.1 g/dL (ref 12.0–15.0)
MCH: 34.5 pg — AB (ref 26.0–34.0)
MCHC: 33.1 g/dL (ref 30.0–36.0)
MCV: 104.3 fL — AB (ref 78.0–100.0)
Platelets: 219 10*3/uL (ref 150–400)
RBC: 3.51 MIL/uL — ABNORMAL LOW (ref 3.87–5.11)
RDW: 13.8 % (ref 11.5–15.5)
WBC: 7.7 10*3/uL (ref 4.0–10.5)

## 2016-03-29 NOTE — ED Triage Notes (Addendum)
Pt states she was in her office and spontaneously started bleeding from her nose.  Has a lac above her right eye that she has no memory of.  A& O x 4 at this time.  Pt's shirt is only buttoned with one button and has no recall of how she came to be clothed like this stating she "doesn't know if this shirt was on before or after".  Pt is on blood thinners but doesn't remember the name.  Actively bleeding in triage.

## 2016-03-30 MED ORDER — TETANUS-DIPHTH-ACELL PERTUSSIS 5-2.5-18.5 LF-MCG/0.5 IM SUSP
0.5000 mL | Freq: Once | INTRAMUSCULAR | Status: AC
  Administered 2016-03-30: 0.5 mL via INTRAMUSCULAR
  Filled 2016-03-30: qty 0.5

## 2016-03-30 MED ORDER — OXYMETAZOLINE HCL 0.05 % NA SOLN
1.0000 | Freq: Once | NASAL | Status: AC
  Administered 2016-03-30: 1 via NASAL
  Filled 2016-03-30: qty 15

## 2016-03-30 NOTE — Discharge Instructions (Signed)
You were seen today for a nosebleed. The cause of the bleed is unknown but sometimes can be related to dry air. Patient to humidify your air at home.  If nosebleed recurs, blowout clots and use Afrin. If this persist after this intervention, you need to be reevaluated. It is unclear how you sustained the injury to the right eye. Your workup including head CT is reassuring. Apply ice as needed. If it any time you do not feel safe at home please do not hesitate to seek help.
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
## 2016-03-30 NOTE — ED Notes (Signed)
Patient ambulated independently without assistance.  Patient denies any shortness of breath or any dizziness.  MD aware and plans to discharge patient home.

## 2016-04-02 ENCOUNTER — Ambulatory Visit (INDEPENDENT_AMBULATORY_CARE_PROVIDER_SITE_OTHER): Payer: Medicare Other | Admitting: Physician Assistant

## 2016-04-02 VITALS — BP 170/84 | HR 68 | Temp 98.4°F | Resp 16 | Ht 60.0 in | Wt 109.0 lb

## 2016-04-02 DIAGNOSIS — I10 Essential (primary) hypertension: Secondary | ICD-10-CM | POA: Diagnosis not present

## 2016-04-02 DIAGNOSIS — E785 Hyperlipidemia, unspecified: Secondary | ICD-10-CM

## 2016-04-02 DIAGNOSIS — Z79899 Other long term (current) drug therapy: Secondary | ICD-10-CM | POA: Diagnosis not present

## 2016-04-02 DIAGNOSIS — E039 Hypothyroidism, unspecified: Secondary | ICD-10-CM | POA: Diagnosis not present

## 2016-04-02 MED ORDER — HYDROCHLOROTHIAZIDE 12.5 MG PO CAPS: 12.5000 mg | ORAL_CAPSULE | Freq: Every day | ORAL | 1 refills | Status: DC

## 2016-04-02 MED ORDER — LEVOTHYROXINE SODIUM 75 MCG PO TABS: 75.0000 ug | ORAL_TABLET | Freq: Every day | ORAL | 0 refills | Status: DC

## 2016-04-02 MED ORDER — METOPROLOL TARTRATE 50 MG PO TABS: ORAL_TABLET | ORAL | 1 refills | Status: DC

## 2016-04-02 MED ORDER — BENAZEPRIL HCL 20 MG PO TABS: 20.0000 mg | ORAL_TABLET | Freq: Two times a day (BID) | ORAL | 1 refills | Status: DC

## 2016-04-02 MED ORDER — LEVOTHYROXINE SODIUM 75 MCG PO TABS: 75.0000 ug | ORAL_TABLET | Freq: Every day | ORAL | 1 refills | Status: DC

## 2016-04-02 MED ORDER — METOPROLOL TARTRATE 50 MG PO TABS: ORAL_TABLET | ORAL | 0 refills | Status: DC

## 2016-04-02 MED ORDER — HYDROCHLOROTHIAZIDE 12.5 MG PO CAPS: 12.5000 mg | ORAL_CAPSULE | Freq: Every day | ORAL | 0 refills | Status: DC

## 2016-04-02 MED ORDER — BENAZEPRIL HCL 20 MG PO TABS: 20.0000 mg | ORAL_TABLET | Freq: Two times a day (BID) | ORAL | 0 refills | Status: DC

## 2016-04-02 MED ORDER — ATORVASTATIN CALCIUM 40 MG PO TABS: ORAL_TABLET | ORAL | 0 refills | Status: DC

## 2016-04-02 MED ORDER — ATORVASTATIN CALCIUM 40 MG PO TABS: ORAL_TABLET | ORAL | 1 refills | Status: DC

## 2016-04-02 NOTE — Progress Notes (Signed)
MRN: [id number] DOB: 23-Nov-1945  Subjective:   Terry Allison is a 70 y.o. female presenting for chief complaint of medication refill.   Currently managed with lopressor 50mg  BID, HCTZ 12,4 daily, and benazepril 20mg  BID. Patient is not checking blood pressure at home.  Reports lightheadedness, dizziness. Denies chronic headache, double vision, chest pain, shortness of breath, heart racing, palpitations, nausea, vomiting, abdominal pain, hematuria, lower leg swelling. Denies smoking, alcohol use daily. Pt has been out of medication for over a week.   Hypothyroidism: Managed with synthroid 75mcg. Her last TSH one year ago was 559 because pt had been off of her medication for a month. She started taking 88mcg and had TSH repeated a month later. The TSH was then 0.11 and Dr. Cleta Albertsaub recommended she come back in a month for a TSH repeat but pt did not come back so she has been taking 75mcg.   Hyperlipidemia: Managed with lipitor 40mg  daily. Her last lipid was one year ago and showed cholesterol of 189 and LDL of 83.   Of note, patient also takes omeprazole 20mg  OTC daily. She cannot tell me how long she has been taking it but notes that it work well for indigestion.   Pt has not drank much water today. She is not fasting today.   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   Terry Allison has a current medication list which includes the following prescription(s): aspirin, atorvastatin, benazepril, fluticasone, fluticasone, hydrochlorothiazide, ipratropium, levothyroxine, metoprolol, and omeprazole. Also is allergic to tramadol.  Terry Allison  has a past medical history of Allergy; Asthma; COPD (chronic obstructive pulmonary disease) (HCC); Depression; Hypertension; Neuromuscular disorder (HCC); and Thyroid disease. Also  has a past surgical history that includes Tubal ligation; Spine surgery; Tonsilectomy/adenoidectomy with myringotomy; ORIF  tibia plateau (Right, 03/05/2014); and Fracture surgery.  Objective:   Vitals: BP (!) 170/84   Pulse 68   Temp 98.4 F (36.9 C) (Oral)   Resp 16   Ht 5' (1.524 m)   Wt 109 lb (49.4 kg)   SpO2 99%   BMI 21.29 kg/m   Physical Exam  Constitutional: She is oriented to person, place, and time. She appears well-developed and well-nourished.  HENT:  Head: Normocephalic and atraumatic.  Eyes: Conjunctivae are normal. Pupils are equal, round, and reactive to light.  Neck: Normal range of motion.  Cardiovascular: Normal rate, regular rhythm and intact distal pulses.   Pulmonary/Chest: Effort normal and breath sounds normal.  Musculoskeletal:       Right lower leg: She exhibits no swelling.       Left lower leg: She exhibits no swelling.  Neurological: She is alert and oriented to person, place, and time.  Skin: Skin is warm and dry. Bruising (over right eye, pt had a fall on 10/29 and was evaluated in the ED) noted.  Psychiatric: She has a normal mood and affect.  Vitals reviewed.   BP Readings from Last 3 Encounters:  04/02/16 (!) 170/84  03/30/16 151/74  01/02/15 (!) 170/92   No results found for this or any previous visit (from the past 24 hour(s)).  Assessment and Plan :  1. Essential hypertension Await lab results  -Pt instructed to check bp while out of office. Goal is <140/90 -Follow up in 3 months for recheck - CBC with Differential/Platelet - COMPLETE METABOLIC PANEL WITH GFR - benazepril (LOTENSIN) 20 MG tablet; Take 1  tablet (20 mg total) by mouth 2 (two) times daily.  Dispense: 180 tablet; Refill: 0 - hydrochlorothiazide (MICROZIDE) 12.5 MG capsule; Take 1 capsule (12.5 mg total) by mouth daily.  Dispense: 90 capsule; Refill: 0 - metoprolol (LOPRESSOR) 50 MG tablet; Take 1 tablet twice daily  Dispense: 180 tablet; Refill: 0  2. Hyperlipidemia, unspecified hyperlipidemia type - Lipid panel - atorvastatin (LIPITOR) 40 MG tablet; TAKE 1 TABLET BY MOUTH EVERY DAY.   Dispense: 90 tablet; Refill: 0  3. Hypothyroidism, unspecified type -Given 30 day prescription, await results for additional refills - Thyroid Panel With TSH  4. Current use of proton pump inhibitor -Pt instructed to taper off omeprazole daily, she understands and agrees to work on this.  - Magnesium   Benjiman CoreBrittany Ramona Ruark, PA-C  [facility] 04/02/2016 6:12 PM

## 2016-04-02 NOTE — Patient Instructions (Addendum)
  Please check your bp while out of the office this week once you are taking your medication. Your goal is <140/90 and >100/60. If your bp is consistently >160/100, call our office as we may need to adjust your medications.   Please follow up in 3 months for hypertension management.   We will contact you once we have your lab results, we will let you know at this time if we need to make any changes with your thyroid medicine at this time. If we do, we will have to repeat TSH work in 4-6 weeks so we will let you know.   Thank you for letting me participate in your health and well being.   IF you received an x-ray today, you will receive an invoice from [facility]. Please contact [facility] at [phone] with questions or concerns regarding your invoice.   IF you received labwork today, you will receive an invoice from [facility]. Please contact [facility] at [phone] with questions or concerns regarding your invoice.   Our billing staff will not be able to assist you with questions regarding bills from these companies.  You will be contacted with the lab results as soon as they are available. The fastest way to get your results is to activate your My Chart account. Instructions are located on the last page of this paperwork. If you have not heard from us regarding the results in 2 weeks, please contact this office.

## 2016-04-03 LAB — CBC WITH DIFFERENTIAL/PLATELET
BASOS PCT: 0 %
Basophils Absolute: 0 cells/uL (ref 0–200)
EOS ABS: 64 {cells}/uL (ref 15–500)
EOS PCT: 1 %
HCT: 36.3 % (ref 35.0–45.0)
Hemoglobin: 11.8 g/dL (ref 11.7–15.5)
LYMPHS PCT: 28 %
Lymphs Abs: 1792 cells/uL (ref 850–3900)
MCH: 33.9 pg — ABNORMAL HIGH (ref 27.0–33.0)
MCHC: 32.5 g/dL (ref 32.0–36.0)
MCV: 104.3 fL — AB (ref 80.0–100.0)
MONOS PCT: 10 %
MPV: 10.8 fL (ref 7.5–12.5)
Monocytes Absolute: 640 cells/uL (ref 200–950)
NEUTROS ABS: 3904 {cells}/uL (ref 1500–7800)
Neutrophils Relative %: 61 %
PLATELETS: 213 10*3/uL (ref 140–400)
RBC: 3.48 MIL/uL — AB (ref 3.80–5.10)
RDW: 14.1 % (ref 11.0–15.0)
WBC: 6.4 10*3/uL (ref 3.8–10.8)

## 2016-04-03 LAB — THYROID PANEL WITH TSH
FREE THYROXINE INDEX: 2.5 (ref 1.4–3.8)
T3 Uptake: 33 % (ref 22–35)
T4 TOTAL: 7.6 ug/dL (ref 4.5–12.0)
TSH: 1.26 mIU/L

## 2016-04-03 LAB — MAGNESIUM: Magnesium: 1.6 mg/dL (ref 1.5–2.5)

## 2016-04-04 LAB — COMPLETE METABOLIC PANEL WITH GFR
ALT: 14 U/L (ref 6–29)
AST: 26 U/L (ref 10–35)
Albumin: 4.2 g/dL (ref 3.6–5.1)
Alkaline Phosphatase: 61 U/L (ref 33–130)
BILIRUBIN TOTAL: 0.5 mg/dL (ref 0.2–1.2)
BUN: 17 mg/dL (ref 7–25)
CHLORIDE: 104 mmol/L (ref 98–110)
CO2: 22 mmol/L (ref 20–31)
CREATININE: 1.15 mg/dL — AB (ref 0.50–0.99)
Calcium: 9.2 mg/dL (ref 8.6–10.4)
GFR, EST AFRICAN AMERICAN: 56 mL/min — AB (ref >=60)
GFR, Est Non African American: 49 mL/min — ABNORMAL LOW (ref >=60)
GLUCOSE: 94 mg/dL (ref 65–99)
Potassium: 4.5 mmol/L (ref 3.5–5.3)
SODIUM: 140 mmol/L (ref 135–146)
TOTAL PROTEIN: 6.8 g/dL (ref 6.1–8.1)

## 2016-04-04 LAB — LIPID PANEL
Cholesterol: 229 mg/dL — ABNORMAL HIGH (ref 125–200)
HDL: 111 mg/dL (ref >=46)
LDL CALC: 84 mg/dL (ref <130)
TRIGLYCERIDES: 171 mg/dL — AB (ref <150)
Total CHOL/HDL Ratio: 2.1 Ratio (ref <=5.0)
VLDL: 34 mg/dL — AB (ref <30)

## 2016-04-10 ENCOUNTER — Telehealth: Payer: Self-pay | Admitting: Emergency Medicine

## 2016-04-10 NOTE — Telephone Encounter (Signed)
-----   Message from Terry RiverBrittany D Wiseman, PA-C sent at 04/08/2016 10:37 AM EST ----- Please call patient and let her know that her magnesium and tsh were normal. I will give her an additional two months of refills for her hypothryoidism. She will need to follow up as discussed in office in three months. Her results also show that she has normal electrolytes and liver enzymes. Her glucose was slightly elevated and her kidney function is slightly decreased but it appears that she is in her normal range for these values. It will be important that at the next visit she is completely hydrated drinking at least 64 oz of water daily and is fasting so we can recheck these values then. Her cholesterol was also elevated at 229 and triglycerides at 171. She needs to continue her lipitor and once again make sure she is fasting at next visit so we can recheck these. Tell her to try to lower the amount of saturated fats in your diet, which are found in red meat, butter, cheese, and coconut oil. Replace with monosaturated fats such as olive oil. You can also add sources of omega-3 fatty acids to your diet, like salmon and tuna. We will see you in 3 months! Make sure she knows that she can call a week advance for an appointment with me. Thanks!

## 2016-05-04 ENCOUNTER — Other Ambulatory Visit: Payer: Self-pay | Admitting: Emergency Medicine

## 2016-05-04 NOTE — Telephone Encounter (Signed)
04/2016 last ov and labs needs lab in january

## 2016-06-03 ENCOUNTER — Ambulatory Visit (INDEPENDENT_AMBULATORY_CARE_PROVIDER_SITE_OTHER): Payer: PPO | Admitting: Physician Assistant

## 2016-06-03 ENCOUNTER — Encounter: Payer: Self-pay | Admitting: Physician Assistant

## 2016-06-03 ENCOUNTER — Ambulatory Visit (INDEPENDENT_AMBULATORY_CARE_PROVIDER_SITE_OTHER): Payer: PPO

## 2016-06-03 VITALS — BP 128/64 | HR 67 | Temp 99.2°F | Resp 16 | Ht 60.0 in | Wt 106.0 lb

## 2016-06-03 DIAGNOSIS — R05 Cough: Secondary | ICD-10-CM | POA: Diagnosis not present

## 2016-06-03 DIAGNOSIS — R059 Cough, unspecified: Secondary | ICD-10-CM

## 2016-06-03 DIAGNOSIS — R062 Wheezing: Secondary | ICD-10-CM

## 2016-06-03 MED ORDER — AZITHROMYCIN 250 MG PO TABS: ORAL_TABLET | ORAL | 0 refills | Status: DC

## 2016-06-03 MED ORDER — IPRATROPIUM BROMIDE 0.02 % IN SOLN
0.5000 mg | Freq: Once | RESPIRATORY_TRACT | Status: AC
  Administered 2016-06-03: 0.5 mg via RESPIRATORY_TRACT

## 2016-06-03 MED ORDER — LEVOTHYROXINE SODIUM 75 MCG PO TABS: 75.0000 ug | ORAL_TABLET | Freq: Every day | ORAL | 0 refills | Status: DC

## 2016-06-03 MED ORDER — ALBUTEROL SULFATE (2.5 MG/3ML) 0.083% IN NEBU
2.5000 mg | INHALATION_SOLUTION | Freq: Once | RESPIRATORY_TRACT | Status: AC
  Administered 2016-06-03: 2.5 mg via RESPIRATORY_TRACT

## 2016-06-03 MED ORDER — BENZONATATE 100 MG PO CAPS: 100.0000 mg | ORAL_CAPSULE | Freq: Three times a day (TID) | ORAL | 0 refills | Status: DC | PRN

## 2016-06-03 NOTE — Patient Instructions (Addendum)
Go to [address] building and have your CXR. I will contact you with your lab results and we will discuss your treatment plan at this point.   Thank you for letting me participate in your health and well being.     IF you received an x-ray today, you will receive an invoice from [facility]. Please contact [facility] at [phone] with questions or concerns regarding your invoice.   IF you received labwork today, you will receive an invoice from [facility]. Please contact [facility] at [phone] with questions or concerns regarding your invoice.   Our billing staff will not be able to assist you with questions regarding bills from these companies.  You will be contacted with the lab results as soon as they are available. The fastest way to get your results is to activate your My Chart account. Instructions are located on the last page of this paperwork. If you have not heard from us regarding the results in 2 weeks, please contact this office.

## 2016-06-03 NOTE — Progress Notes (Signed)
MRN: [id number] DOB: [birth year]  Subjective:   Terry Allison is a 71 y.o. female presenting for chief complaint of Cough (congestion/x 5 days); Sore Throat (x 5 days); and Diarrhea (on and off for the past couple of days) .  Reports 4 day history of productive cough with yellow/green production, sinus congestion, wheezing and chest tightness, diarrhea and chills. Has tried [facility] severe cold capsules and mucinex and mushroom tincture and zinc with no relief. A humidifier helps Denies fever, ear pain, nausea, vomiting and abdominal pain. Has had sick contact with husband who has a terrible cough. Chronic history of seasonal allergie but does not take anything, no history of asthma. Thinks she may have underlying borderline COPD. Patient has had flu shot this season. Quit smoking 20 years ago but smoked 1-2 ppd for 30 days. Denies any other aggravating or relieving factors, no other questions or concerns.  Dekota has a current medication list which includes the following prescription(s): aspirin, atorvastatin, benazepril, fluticasone, hydrochlorothiazide, ipratropium, levothyroxine, metoprolol, ranitidine, fluticasone, levothyroxine, and omeprazole. Also is allergic to tramadol.  Orpah  has a past medical history of Allergy; Asthma; COPD (chronic obstructive pulmonary disease) (HCC); Depression; Hypertension; Neuromuscular disorder (HCC); and Thyroid disease. Also  has a past surgical history that includes Tubal ligation; Spine surgery; Tonsilectomy/adenoidectomy with myringotomy; ORIF tibia plateau (Right, 03/05/2014); and Fracture surgery.   Objective:   Vitals: BP 128/64   Pulse 67   Temp 99.2 F (37.3 C) (Oral)   Resp 16   Ht 5' (1.524 m)   Wt 106 lb (48.1 kg)   SpO2 97%   BMI 20.70 kg/m   Physical Exam  Constitutional: She appears well-developed and well-nourished. She appears distressed (mild due to cough).  HENT:  Right Ear: Tympanic membrane, external ear and  ear canal normal.  Left Ear: Tympanic membrane, external ear and ear canal normal.  Nose: Mucosal edema (mild on right side) present. Right sinus exhibits no maxillary sinus tenderness and no frontal sinus tenderness. Left sinus exhibits no maxillary sinus tenderness and no frontal sinus tenderness.  Mouth/Throat: Uvula is midline and mucous membranes are normal.  Cardiovascular: Normal rate, regular rhythm and normal heart sounds.   Pulmonary/Chest: Effort normal. She has wheezes (bilateral, with forced expiratory breathing ). She has rales in the right middle field.  Lymphadenopathy:       Head (right side): No submental, no submandibular, no tonsillar, no preauricular, no posterior auricular and no occipital adenopathy present.       Head (left side): No submental, no submandibular, no tonsillar, no preauricular, no posterior auricular and no occipital adenopathy present.    She has no cervical adenopathy.       Right: No supraclavicular adenopathy present.       Left: No supraclavicular adenopathy present.  Skin: Skin is warm and dry.  Psychiatric: She has a normal mood and affect.   No results found for this or any previous visit (from the past 24 hour(s)).  Dg Chest 2 View  Result Date: 06/03/2016 CLINICAL DATA:  Cough x4 days EXAM: CHEST  2 VIEW COMPARISON:  03/02/2014 CXR FINDINGS: Emphysematous hyperinflation of the lungs upper lobe predominant with slight crowding of lower lobe interstitial lung markings. Slightly more confluent airspace opacities in the right middle lobe distribution. Subtle pneumonia is not entirely excluded. No CHF, effusions or pneumothoraces. Mild apical pleuroparenchymal scarring. Chronic left sixth rib fracture. IMPRESSION: Mild emphysematous hyperinflation with small focus of airspace disease the right middle lobe distribution.  Pneumonia is not excluded. Electronically Signed   By: Tollie Ethavid  Kwon M.D.   On: 06/03/2016 18:46   No results found for this or any  previous visit (from the past 24 hour(s)).  Post breathing treatment, pt states it did not provide much relief. Forced expiratory wheezing still auscultated on exam.   Assessment and Plan :  1. Wheezing - albuterol (PROVENTIL) (2.5 MG/3ML) 0.083% nebulizer solution 2.5 mg; Take 3 mLs (2.5 mg total) by nebulization once. - ipratropium (ATROVENT) nebulizer solution 0.5 mg; Take 2.5 mLs (0.5 mg total) by nebulization once. - DG Chest 2 View; Future - POCT CBC  2. Cough -CXR shows lightly more confluent airspace opacities in the right middle lobe distribution. Subtle pneumonia is not entirely excluded. Will treat for potential bacterial infectious etiology. Recommend repeat CXR in 4-6 weeks. Instructed to return to clinic if symptoms worsen, do not improve with treatment,  or as needed - azithromycin (ZITHROMAX) 250 MG tablet; Take 2 tabs PO x 1 dose, then 1 tab PO QD x 4 days  Dispense: 6 tablet; Refill: 0 - benzonatate (TESSALON) 100 MG capsule; Take 1-2 capsules (100-200 mg total) by mouth 3 (three) times daily as needed for cough.  Dispense: 40 capsule; Refill: 0  Benjiman CoreBrittany Raevyn Sokol, PA-C  [facility] 06/03/2016 5:42 PM

## 2016-06-03 NOTE — Progress Notes (Deleted)
    MRN: [id number] DOB: [birth year]  Subjective:   Terry Allison is a 71 y.o. female presenting for chief complaint of Cough (congestion/x 5 days); Sore Throat (x 5 days); and Diarrhea (on and off for the past couple of days) .  Reports *** history of {URI Symptoms :210800001}, {Systemic [phone]}. Has tried *** relief. Denies ***fever, {URI Symptoms :210800001}, {Systemic [phone]}. Has *** had *** sick contact with ***. *** history of seasonal allergies, history of asthma. Patient *** flu shot this season. *** smoking, *** alcohol. Denies any other aggravating or relieving factors, no other questions or concerns.  Terry Allison has a current medication list which includes the following prescription(s): aspirin, atorvastatin, benazepril, fluticasone, hydrochlorothiazide, ipratropium, levothyroxine, metoprolol, ranitidine, fluticasone, levothyroxine, and omeprazole. Also is allergic to tramadol.  Terry Allison  has a past medical history of Allergy; Asthma; COPD (chronic obstructive pulmonary disease) (HCC); Depression; Hypertension; Neuromuscular disorder (HCC); and Thyroid disease. Also  has a past surgical history that includes Tubal ligation; Spine surgery; Tonsilectomy/adenoidectomy with myringotomy; ORIF tibia plateau (Right, 03/05/2014); and Fracture surgery.   Objective:   Vitals: BP 128/64   Pulse 67   Temp 99.2 F (37.3 C) (Oral)   Resp 16   Ht 5' (1.524 m)   Wt 106 lb (48.1 kg)   SpO2 97%   BMI 20.70 kg/m   Physical Exam  No results found for this or any previous visit (from the past 24 hour(s)).  Assessment and Plan :  There are no diagnoses linked to this encounter.  Terry Allison Terry Boschee, PA-C  [facility] 06/03/2016 5:27 PM

## 2016-06-04 LAB — POCT CBC
GRANULOCYTE PERCENT: 68.5 % (ref 37–80)
HCT, POC: 34.5 % — AB (ref 37.7–47.9)
Hemoglobin: 12 g/dL — AB (ref 12.2–16.2)
Lymph, poc: 1.3 (ref 0.6–3.4)
MCH, POC: 35.9 pg — AB (ref 27–31.2)
MCHC: 34.7 g/dL (ref 31.8–35.4)
MCV: 103.5 fL — AB (ref 80–97)
MID (cbc): 0.4 (ref 0–0.9)
MPV: 8.7 fL (ref 0–99.8)
PLATELET COUNT, POC: 175 10*3/uL (ref 142–424)
POC Granulocyte: 3.8 (ref 2–6.9)
POC LYMPH %: 24.5 % (ref 10–50)
POC MID %: 7 %M (ref 0–12)
RBC: 3.34 M/uL — AB (ref 4.04–5.48)
RDW, POC: 14.2 %
WBC: 5.5 10*3/uL (ref 4.6–10.2)

## 2016-06-06 ENCOUNTER — Other Ambulatory Visit: Payer: Self-pay | Admitting: Physician Assistant

## 2016-06-08 ENCOUNTER — Ambulatory Visit (INDEPENDENT_AMBULATORY_CARE_PROVIDER_SITE_OTHER): Payer: PPO | Admitting: Family Medicine

## 2016-06-08 ENCOUNTER — Ambulatory Visit (INDEPENDENT_AMBULATORY_CARE_PROVIDER_SITE_OTHER): Payer: PPO

## 2016-06-08 VITALS — BP 120/70 | HR 62 | Temp 97.4°F | Ht 60.0 in | Wt 102.8 lb

## 2016-06-08 DIAGNOSIS — J189 Pneumonia, unspecified organism: Secondary | ICD-10-CM

## 2016-06-08 DIAGNOSIS — R062 Wheezing: Secondary | ICD-10-CM

## 2016-06-08 DIAGNOSIS — J181 Lobar pneumonia, unspecified organism: Secondary | ICD-10-CM | POA: Diagnosis not present

## 2016-06-08 DIAGNOSIS — R05 Cough: Secondary | ICD-10-CM | POA: Diagnosis not present

## 2016-06-08 DIAGNOSIS — R197 Diarrhea, unspecified: Secondary | ICD-10-CM | POA: Diagnosis not present

## 2016-06-08 DIAGNOSIS — R0600 Dyspnea, unspecified: Secondary | ICD-10-CM

## 2016-06-08 LAB — POCT CBC
Granulocyte percent: 65.1 %G (ref 37–80)
HCT, POC: 39.8 % (ref 37.7–47.9)
HEMOGLOBIN: 13.8 g/dL (ref 12.2–16.2)
LYMPH, POC: 2.8 (ref 0.6–3.4)
MCH: 35.7 pg — AB (ref 27–31.2)
MCHC: 34.6 g/dL (ref 31.8–35.4)
MCV: 103.1 fL — AB (ref 80–97)
MID (CBC): 0.5 (ref 0–0.9)
MPV: 8.5 fL (ref 0–99.8)
PLATELET COUNT, POC: 269 10*3/uL (ref 142–424)
POC Granulocyte: 6.2 (ref 2–6.9)
POC LYMPH PERCENT: 29.5 %L (ref 10–50)
POC MID %: 5.4 % (ref 0–12)
RBC: 3.86 M/uL — AB (ref 4.04–5.48)
RDW, POC: 13.7 %
WBC: 9.6 10*3/uL (ref 4.6–10.2)

## 2016-06-08 MED ORDER — ALBUTEROL SULFATE (2.5 MG/3ML) 0.083% IN NEBU
2.5000 mg | INHALATION_SOLUTION | Freq: Once | RESPIRATORY_TRACT | Status: AC
  Administered 2016-06-08: 2.5 mg via RESPIRATORY_TRACT

## 2016-06-08 MED ORDER — IPRATROPIUM BROMIDE 0.02 % IN SOLN
0.5000 mg | Freq: Once | RESPIRATORY_TRACT | Status: AC
  Administered 2016-06-08: 0.5 mg via RESPIRATORY_TRACT

## 2016-06-08 MED ORDER — ALBUTEROL SULFATE 108 (90 BASE) MCG/ACT IN AEPB: 1.0000 | INHALATION_SPRAY | RESPIRATORY_TRACT | 0 refills | Status: DC | PRN

## 2016-06-08 MED ORDER — LEVOFLOXACIN 500 MG PO TABS: 500.0000 mg | ORAL_TABLET | Freq: Every day | ORAL | 0 refills | Status: DC
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
## 2016-06-08 NOTE — Patient Instructions (Addendum)
Change antibiotic to Levaquin 1 pill once per day. Albuterol inhaler 1-2 puffs every 4 hours as needed. Return as soon as possible with stool test to make sure you do not have a secondary bacterial infection. Follow-up with Dr. Alvy Bimler in 2 days, sooner if worse.   Bronchospasm, Adult A bronchospasm is a spasm or tightening of the airways going into the lungs. During a bronchospasm breathing becomes more difficult because the airways get smaller. When this happens there can be coughing, a whistling sound when breathing (wheezing), and difficulty breathing. Bronchospasm is often associated with asthma, but not all patients who experience a bronchospasm have asthma. What are the causes? A bronchospasm is caused by inflammation or irritation of the airways. The inflammation or irritation may be triggered by:  Allergies (such as to animals, pollen, food, or mold). Allergens that cause bronchospasm may cause wheezing immediately after exposure or many hours later.  Infection. Viral infections are believed to be the most common cause of bronchospasm.  Exercise.  Irritants (such as pollution, cigarette smoke, strong odors, aerosol sprays, and paint fumes).  Weather changes. Winds increase molds and pollens in the air. Rain refreshes the air by washing irritants out. Cold air may cause inflammation.  Stress and emotional upset. What are the signs or symptoms?  Wheezing.  Excessive nighttime coughing.  Frequent or severe coughing with a simple cold.  Chest tightness.  Shortness of breath. How is this diagnosed? Bronchospasm is usually diagnosed through a history and physical exam. Tests, such as chest X-rays, are sometimes done to look for other conditions. How is this treated?  Inhaled medicines can be given to open up your airways and help you breathe. The medicines can be given using either an inhaler or a nebulizer machine.  Corticosteroid medicines may be given for severe  bronchospasm, usually when it is associated with asthma. Follow these instructions at home:  Always have a plan prepared for seeking medical care. Know when to call your health care provider and local emergency services (911 in the [place] Know where you can access local emergency care.  Only take medicines as directed by your health care provider.  If you were prescribed an inhaler or nebulizer machine, ask your health care provider to explain how to use it correctly. Always use a spacer with your inhaler if you were given one.  It is necessary to remain calm during an attack. Try to relax and breathe more slowly.  Control your home environment in the following ways:  Change your heating and air conditioning filter at least once a month.  Limit your use of fireplaces and wood stoves.  Do not smoke and do not allow smoking in your home.  Avoid exposure to perfumes and fragrances.  Get rid of pests (such as roaches and mice) and their droppings.  Throw away plants if you see mold on them.  Keep your house clean and dust free.  Replace carpet with wood, tile, or vinyl flooring. Carpet can trap dander and dust.  Use allergy-proof pillows, mattress covers, and box spring covers.  Wash bed sheets and blankets every week in hot water and dry them in a dryer.  Use blankets that are made of polyester or cotton.  Wash hands frequently. Contact a health care provider if:  You have muscle aches.  You have chest pain.  The sputum changes from clear or white to yellow, green, gray, or bloody.  The sputum you cough up gets thicker.  There are problems that  may be related to the medicine you are given, such as a rash, itching, swelling, or trouble breathing. Get help right away if:  You have worsening wheezing and coughing even after taking your prescribed medicines.  You have increased difficulty breathing.  You develop severe chest pain. This information is not intended to  replace advice given to you by your health care provider. Make sure you discuss any questions you have with your health care provider. Document Released: 05/21/2003 Document Revised: 10/24/2015 Document Reviewed: 11/07/2012 Elsevier Interactive Patient Education  2017 [employer]  Community-Acquired Pneumonia, Adult Pneumonia is an infection of the lungs. There are different types of pneumonia. One type can develop while a person is in a hospital. A different type, called community-acquired pneumonia, develops in people who are not, or have not recently been, in the hospital or other health care facility. What are the causes? Pneumonia may be caused by bacteria, viruses, or funguses. Community-acquired pneumonia is often caused by Streptococcus pneumonia bacteria. These bacteria are often passed from one person to another by breathing in droplets from the cough or sneeze of an infected person. What increases the risk? The condition is more likely to develop in:  People who havechronic diseases, such as chronic obstructive pulmonary disease (COPD), asthma, congestive heart failure, cystic fibrosis, diabetes, or kidney disease.  People who haveearly-stage or late-stage HIV.  People who havesickle cell disease.  People who havehad their spleen removed (splenectomy).  People who havepoor Administratordental hygiene.  People who havemedical conditions that increase the risk of breathing in (aspirating) secretions their own mouth and nose.  People who havea weakened immune system (immunocompromised).  People who smoke.  People whotravel to areas where pneumonia-causing germs commonly exist.  People whoare around animal habitats or animals that have pneumonia-causing germs, including birds, bats, rabbits, cats, and farm animals. What are the signs or symptoms? Symptoms of this condition include:  Adry cough.  A wet (productive) cough.  Fever.  Sweating.  Chest pain, especially when  breathing deeply or coughing.  Rapid breathing or difficulty breathing.  Shortness of breath.  Shaking chills.  Fatigue.  Muscle aches. How is this diagnosed? Your health care provider will take a medical history and perform a physical exam. You may also have other tests, including:  Imaging studies of your chest, including X-rays.  Tests to check your blood oxygen level and other blood gases.  Other tests on blood, mucus (sputum), fluid around your lungs (pleural fluid), and urine. If your pneumonia is severe, other tests may be done to identify the specific cause of your illness. How is this treated? The type of treatment that you receive depends on many factors, such as the cause of your pneumonia, the medicines you take, and other medical conditions that you have. For most adults, treatment and recovery from pneumonia may occur at home. In some cases, treatment must happen in a hospital. Treatment may include:  Antibiotic medicines, if the pneumonia was caused by bacteria.  Antiviral medicines, if the pneumonia was caused by a virus.  Medicines that are given by mouth or through an IV tube.  Oxygen.  Respiratory therapy. Although rare, treating severe pneumonia may include:  Mechanical ventilation. This is done if you are not breathing well on your own and you cannot maintain a safe blood oxygen level.  Thoracentesis. This procedureremoves fluid around one lung or both lungs to help you breathe better. Follow these instructions at home:  Take over-the-counter and prescription medicines only as told  by your health care provider.  Only takecough medicine if you are losing sleep. Understand that cough medicine can prevent your body's natural ability to remove mucus from your lungs.  If you were prescribed an antibiotic medicine, take it as told by your health care provider. Do not stop taking the antibiotic even if you start to feel better.  Sleep in a semi-upright  position at night. Try sleeping in a reclining chair, or place a few pillows under your head.  Do not use tobacco products, including cigarettes, chewing tobacco, and e-cigarettes. If you need help quitting, ask your health care provider.  Drink enough water to keep your urine clear or pale yellow. This will help to thin out mucus secretions in your lungs. How is this prevented? There are ways that you can decrease your risk of developing community-acquired pneumonia. Consider getting a pneumococcal vaccine if:  You are older than 70 years of age.  You are older than 70 years of age and are undergoing cancer treatment, have chronic lung disease, or have other medical conditions that affect your immune system. Ask your health care provider if this applies to you. There are different types and schedules of pneumococcal vaccines. Ask your health care provider which vaccination option is best for you. You may also prevent community-acquired pneumonia if you take these actions:  Get an influenza vaccine every year. Ask your health care provider which type of influenza vaccine is best for you.  Go to the dentist on a regular basis.  Wash your hands often. Use hand sanitizer if soap and water are not available. Contact a health care provider if:  You have a fever.  You are losing sleep because you cannot control your cough with cough medicine. Get help right away if:  You have worsening shortness of breath.  You have increased chest pain.  Your sickness becomes worse, especially if you are an older adult or have a weakened immune system.  You cough up blood. This information is not intended to replace advice given to you by your health care provider. Make sure you discuss any questions you have with your health care provider. Document Released: 05/18/2005 Document Revised: 09/26/2015 Document Reviewed: 09/12/2014 Elsevier Interactive Patient Education  2017 [employer].    IF you  received an x-ray today, you will receive an invoice from [facility]. Please contact [facility] at [phone] with questions or concerns regarding your invoice.   IF you received labwork today, you will receive an invoice from [place]. Please contact [facility] at [phone] with questions or concerns regarding your invoice.   Our billing staff will not be able to assist you with questions regarding bills from these companies.  You will be contacted with the lab results as soon as they are available. The fastest way to get your results is to activate your My Chart account. Instructions are located on the last page of this paperwork. If you have not heard from [place] regarding the results in 2 weeks, please contact this office.

## 2016-06-08 NOTE — Progress Notes (Signed)
Called pt to come back and get stool collection stuff it is up front for patient

## 2016-06-10 ENCOUNTER — Ambulatory Visit: Payer: PPO

## 2016-06-28 ENCOUNTER — Other Ambulatory Visit: Payer: Self-pay | Admitting: Physician Assistant

## 2016-06-28 DIAGNOSIS — I10 Essential (primary) hypertension: Secondary | ICD-10-CM

## 2016-08-28 ENCOUNTER — Other Ambulatory Visit: Payer: Self-pay | Admitting: Physician Assistant

## 2016-09-28 ENCOUNTER — Other Ambulatory Visit: Payer: Self-pay | Admitting: Physician Assistant

## 2016-09-28 DIAGNOSIS — I10 Essential (primary) hypertension: Secondary | ICD-10-CM

## 2016-10-27 DIAGNOSIS — H2511 Age-related nuclear cataract, right eye: Secondary | ICD-10-CM | POA: Diagnosis not present

## 2016-10-27 DIAGNOSIS — H5213 Myopia, bilateral: Secondary | ICD-10-CM | POA: Diagnosis not present

## 2016-10-27 DIAGNOSIS — H25812 Combined forms of age-related cataract, left eye: Secondary | ICD-10-CM | POA: Diagnosis not present

## 2016-10-27 DIAGNOSIS — H524 Presbyopia: Secondary | ICD-10-CM | POA: Diagnosis not present

## 2016-10-27 DIAGNOSIS — H52223 Regular astigmatism, bilateral: Secondary | ICD-10-CM | POA: Diagnosis not present

## 2016-12-07 ENCOUNTER — Other Ambulatory Visit: Payer: Self-pay | Admitting: Physician Assistant

## 2016-12-07 DIAGNOSIS — I10 Essential (primary) hypertension: Secondary | ICD-10-CM

## 2016-12-15 ENCOUNTER — Telehealth: Payer: Self-pay | Admitting: Physician Assistant

## 2016-12-15 NOTE — Telephone Encounter (Signed)
[place] Please advise or refill if appropriate. Thanks

## 2016-12-15 NOTE — Telephone Encounter (Signed)
Pt is calling to get a refill of her benazepril medication.  Pt is currently out of medication.  Original request from pharmacy was denied.  I made an appt with Barnett AbuWiseman for BP med refill at 11:20 12/16/16.  Pt is requesting that the entire prescription be refilled since she will be paying the same amount of money for the temporary refill, as she would for the 180 tablet refill.  Please advise.  Pt is currently out of medication.

## 2016-12-16 ENCOUNTER — Encounter: Payer: Self-pay | Admitting: Physician Assistant

## 2016-12-16 ENCOUNTER — Ambulatory Visit (INDEPENDENT_AMBULATORY_CARE_PROVIDER_SITE_OTHER): Payer: PPO | Admitting: Physician Assistant

## 2016-12-16 VITALS — BP 137/72 | HR 54 | Temp 98.2°F | Resp 18 | Ht 60.24 in | Wt 110.4 lb

## 2016-12-16 DIAGNOSIS — E039 Hypothyroidism, unspecified: Secondary | ICD-10-CM | POA: Diagnosis not present

## 2016-12-16 DIAGNOSIS — J309 Allergic rhinitis, unspecified: Secondary | ICD-10-CM

## 2016-12-16 DIAGNOSIS — I10 Essential (primary) hypertension: Secondary | ICD-10-CM | POA: Diagnosis not present

## 2016-12-16 LAB — POCT URINALYSIS DIP (MANUAL ENTRY)
Bilirubin, UA: NEGATIVE
Blood, UA: NEGATIVE
Glucose, UA: NEGATIVE mg/dL
Ketones, POC UA: NEGATIVE mg/dL
LEUKOCYTES UA: NEGATIVE
Nitrite, UA: NEGATIVE
PH UA: 5.5 (ref 5.0–8.0)
Protein Ur, POC: NEGATIVE mg/dL
Spec Grav, UA: 1.015 (ref 1.010–1.025)
UROBILINOGEN UA: 0.2 U/dL

## 2016-12-16 MED ORDER — FLUTICASONE PROPIONATE 50 MCG/ACT NA SUSP: 2.0000 | Freq: Two times a day (BID) | NASAL | 6 refills | Status: DC

## 2016-12-16 MED ORDER — IPRATROPIUM BROMIDE 0.03 % NA SOLN: 2.0000 | Freq: Four times a day (QID) | NASAL | 6 refills | Status: DC

## 2016-12-16 MED ORDER — BENAZEPRIL HCL 20 MG PO TABS: 20.0000 mg | ORAL_TABLET | Freq: Two times a day (BID) | ORAL | 1 refills | Status: DC

## 2016-12-16 MED ORDER — LEVOTHYROXINE SODIUM 75 MCG PO TABS: 75.0000 ug | ORAL_TABLET | Freq: Every day | ORAL | 1 refills | Status: DC

## 2016-12-16 MED ORDER — HYDROCHLOROTHIAZIDE 12.5 MG PO CAPS: 12.5000 mg | ORAL_CAPSULE | Freq: Every day | ORAL | 1 refills | Status: DC

## 2016-12-16 MED ORDER — METOPROLOL TARTRATE 50 MG PO TABS: 50.0000 mg | ORAL_TABLET | Freq: Two times a day (BID) | ORAL | 1 refills | Status: DC

## 2016-12-16 NOTE — Progress Notes (Signed)
MRN: [id number] DOB: [birth year]  Subjective:   Terry Allison is a 71 y.o. female presenting for medication refills for HTN, hypothyroidism, and allergic rhinitis. Pt is fasting today.   1) HTN: Currently managed with lopressor 74m BID, HCTZ 12.5 daily, and benazepril 279mBID. Has been out of benazepril for the past 2 days. Patient is not checking blood pressure at home.   Denies chronic headache, double vision, chest pain, dizziness, shortness of breath, heart racing, palpitations, nausea, vomiting, abdominal pain, hematuria, lower leg swelling. Denies smoking, alcohol use daily.   2) Hypothyroidism: Managed with synthroid 7597m Last thyroid panel with TSH was on 04/02/16 and was normal.   3) Allergic rhinitis: Managed with flonase and nasal atrovent. Has runny nose and sneezing. Has never taken daily oral antihistamine.   [name] a current medication list which includes the following prescription(s): albuterol sulfate, aspirin, atorvastatin, benazepril, fluticasone, hydrochlorothiazide, ipratropium, levothyroxine, metoprolol tartrate, benzonatate, levofloxacin, omeprazole, and ranitidine. Also is allergic to tramadol.  [name] a past medical history of Allergy; Asthma; COPD (chronic obstructive pulmonary disease) ([place]CC[place]handleDepression; Hypertension; Neuromuscular disorder ([place]CC[place]and Thyroid disease. Also  has a past surgical history that includes Tubal ligation; Spine surgery; Tonsilectomy/adenoidectomy with myringotomy; ORIF tibia plateau (Right, 03/05/2014); and Fracture surgery.   Objective:   Vitals: BP 137/72 (BP Location: Right Arm, Patient Position: Sitting, Cuff Size: Normal)   Pulse (!) 54   Temp 98.2 F (36.8 C) (Oral)   Resp 18   Ht 5' 0.24" (1.53 m)   Wt 110 lb 6.4 oz (50.1 kg)   SpO2 98%   BMI 21.39 kg/m   Physical Exam  Constitutional: She is oriented to person, place, and time. She appears well-developed and well-nourished.  HENT:  Head:  Normocephalic and atraumatic.  Right Ear: Tympanic membrane, external ear and ear canal normal.  Left Ear: Tympanic membrane, external ear and ear canal normal.  Nose: Mucosal edema (mild noted bilaterally) present.  Mouth/Throat: Uvula is midline, oropharynx is clear and moist and mucous membranes are normal.  Eyes: Conjunctivae are normal.  Neck: Normal range of motion. No thyroid mass and no thyromegaly present.  Cardiovascular: Normal rate, regular rhythm, normal heart sounds and intact distal pulses.   Pulmonary/Chest: Effort normal and breath sounds normal. She has no wheezes. She has no rales.  Musculoskeletal:       Right lower leg: She exhibits no swelling.       Left lower leg: She exhibits no swelling.  Neurological: She is alert and oriented to person, place, and time.  Reflex Scores:      Tricep reflexes are 2+ on the right side and 2+ on the left side.      Bicep reflexes are 2+ on the right side and 2+ on the left side.      Brachioradialis reflexes are 2+ on the right side and 2+ on the left side.      Patellar reflexes are 2+ on the right side and 2+ on the left side.      Achilles reflexes are 2+ on the right side and 2+ on the left side. Skin: Skin is warm and dry.  Psychiatric: She has a normal mood and affect.  Vitals reviewed.   Results for orders placed or performed in visit on 12/16/16 (from the past 24 hour(s))  POCT urinalysis dipstick     Status: None   Collection Time: 12/16/16  1:09 PM  Result Value Ref Range   Color, UA  yellow yellow   Clarity, UA clear clear   Glucose, UA negative negative mg/dL   Bilirubin, UA negative negative   Ketones, POC UA negative negative mg/dL   Spec Grav, UA 1.015 1.010 - 1.025   Blood, UA negative negative   pH, UA 5.5 5.0 - 8.0   Protein Ur, POC negative negative mg/dL   Urobilinogen, UA 0.2 0.2 or 1.0 E.U./dL   Nitrite, UA Negative Negative   Leukocytes, UA Negative Negative    Assessment and Plan :  1. Essential  hypertension Controlled. Follow up in 6 months for reevaluation.  - CMP14+EGFR - POCT urinalysis dipstick - benazepril (LOTENSIN) 20 MG tablet; Take 1 tablet (20 mg total) by mouth 2 (two) times daily.  Dispense: 180 tablet; Refill: 1 - metoprolol tartrate (LOPRESSOR) 50 MG tablet; Take 1 tablet (50 mg total) by mouth 2 (two) times daily. Take 1 tablet twice daily  Dispense: 180 tablet; Refill: 1 - hydrochlorothiazide (MICROZIDE) 12.5 MG capsule; Take 1 capsule (12.5 mg total) by mouth daily.  Dispense: 90 capsule; Refill: 1  2. Hypothyroidism, unspecified type Controlled. Labs pending. Follow up in 6 months for reevaluation.  - TSH - T3, Free - T4, Free - levothyroxine (SYNTHROID, LEVOTHROID) 75 MCG tablet; Take 1 tablet (75 mcg total) by mouth daily.  Dispense: 90 tablet; Refill: 1  3. Allergic rhinitis, unspecified seasonality, unspecified trigger Encouraged to try OTC oral antihistamine for better control.  - fluticasone (FLONASE) 50 MCG/ACT nasal spray; Place 2 sprays into both nostrils 2 (two) times daily.  Dispense: 16 g; Refill: 6 - ipratropium (ATROVENT) 0.03 % nasal spray; Place 2 sprays into the nose 4 (four) times daily.  Dispense: 30 mL; Refill: [place], PA-C  [facility] 12/16/2016 6:25 PM

## 2016-12-16 NOTE — Patient Instructions (Addendum)
I have given you 6 months of refills for the requested medications today. Please return in 6 months for reevaluation and lab work. If you run out of your lipitor prior to 6 months, please notify our office so we can give you a refill. In terms of lab work, we will contact you within a week with your results. For your allergies, please start taking a zyrtec or claritin daily along with flonase and atrovent to see if this helps with your runny nose. It was a pleasure seeing you today. Thank you for letting me participate in your health and well being.   IF you received an x-ray today, you will [facility] an [facility]. Please contact [facility] at [phone] with questions or concerns regarding your invoice.   IF you received labwork today, you will receive an invoice from [facility]. Please contact [facility] at [phone] with questions or concerns regarding your invoice.   Our billing staff will not be able to assist you with questions regarding bills from these companies.  You will be contacted with the lab results as soon as they are available. The fastest way to get your results is to activate your My Chart account. Instructions are located on the last page of this paperwork. If you have not heard from us regarding the results in 2 weeks, please contact this office.

## 2016-12-16 NOTE — Telephone Encounter (Signed)
She has appointment today. We will refill medication in office. Thanks!

## 2016-12-17 LAB — CMP14+EGFR
ALBUMIN: 4.8 g/dL (ref 3.6–4.8)
ALK PHOS: 66 IU/L (ref 39–117)
ALT: 13 IU/L (ref 0–32)
AST: 22 IU/L (ref 0–40)
Albumin/Globulin Ratio: 2.1 (ref 1.2–2.2)
BILIRUBIN TOTAL: 0.3 mg/dL (ref 0.0–1.2)
BUN / CREAT RATIO: 32 — AB (ref 12–28)
BUN: 35 mg/dL — AB (ref 8–27)
CHLORIDE: 101 mmol/L (ref 96–106)
CO2: 19 mmol/L — ABNORMAL LOW (ref 20–29)
Calcium: 9.8 mg/dL (ref 8.7–10.3)
Creatinine, Ser: 1.08 mg/dL — ABNORMAL HIGH (ref 0.57–1.00)
GFR calc Af Amer: 61 mL/min/{1.73_m2} (ref >59)
GFR calc non Af Amer: 53 mL/min/{1.73_m2} — ABNORMAL LOW (ref >59)
GLUCOSE: 83 mg/dL (ref 65–99)
Globulin, Total: 2.3 g/dL (ref 1.5–4.5)
Potassium: 5 mmol/L (ref 3.5–5.2)
Sodium: 140 mmol/L (ref 134–144)
Total Protein: 7.1 g/dL (ref 6.0–8.5)

## 2016-12-17 LAB — T4, FREE: Free T4: 1.35 ng/dL (ref 0.82–1.77)

## 2016-12-17 LAB — TSH: TSH: 0.27 u[IU]/mL — AB (ref 0.450–4.500)

## 2016-12-17 LAB — T3, FREE: T3 FREE: 2.1 pg/mL (ref 2.0–4.4)

## 2016-12-18 ENCOUNTER — Telehealth: Payer: Self-pay | Admitting: Physician Assistant

## 2016-12-18 NOTE — Telephone Encounter (Signed)
[facility].  She states that he has done surgery on her before in the past but she is wanting a referral so there is no issue with her insurance rather than her scheduling herself. Please advise [phone]

## 2016-12-19 NOTE — Telephone Encounter (Signed)
Please place referral if appropriate. Thanks! °

## 2016-12-22 ENCOUNTER — Other Ambulatory Visit: Payer: Self-pay | Admitting: Physician Assistant

## 2016-12-22 DIAGNOSIS — M542 Cervicalgia: Secondary | ICD-10-CM

## 2016-12-22 NOTE — Telephone Encounter (Signed)
Please call pt and let her know I have placed this referral. Please notify us if she has not heard anything from them in 2 weeks. Thanks!

## 2016-12-22 NOTE — Progress Notes (Signed)
Orders Placed This Encounter  Procedures   Ambulatory referral to Neurosurgery    Referral Priority:   Routine    Referral Type:   Surgical    Referral Reason:   Specialty Services Required    Requested Specialty:   Neurosurgery    Number of Visits Requested:   1    

## 2016-12-23 NOTE — Telephone Encounter (Signed)
Left detailed message that referral has been placed. 

## 2017-02-27 ENCOUNTER — Other Ambulatory Visit: Payer: Self-pay | Admitting: Physician Assistant

## 2017-03-14 ENCOUNTER — Other Ambulatory Visit: Payer: Self-pay | Admitting: Physician Assistant

## 2017-04-03 ENCOUNTER — Ambulatory Visit (INDEPENDENT_AMBULATORY_CARE_PROVIDER_SITE_OTHER): Payer: PPO

## 2017-04-03 ENCOUNTER — Encounter: Payer: Self-pay | Admitting: Physician Assistant

## 2017-04-03 ENCOUNTER — Ambulatory Visit (INDEPENDENT_AMBULATORY_CARE_PROVIDER_SITE_OTHER): Payer: PPO | Admitting: Physician Assistant

## 2017-04-03 VITALS — BP 120/80 | HR 45 | Temp 97.6°F | Resp 16 | Ht 60.04 in | Wt 115.4 lb

## 2017-04-03 DIAGNOSIS — E039 Hypothyroidism, unspecified: Secondary | ICD-10-CM | POA: Diagnosis not present

## 2017-04-03 DIAGNOSIS — R9389 Abnormal findings on diagnostic imaging of other specified body structures: Secondary | ICD-10-CM | POA: Diagnosis not present

## 2017-04-03 DIAGNOSIS — E785 Hyperlipidemia, unspecified: Secondary | ICD-10-CM

## 2017-04-03 DIAGNOSIS — I10 Essential (primary) hypertension: Secondary | ICD-10-CM

## 2017-04-03 DIAGNOSIS — G8929 Other chronic pain: Secondary | ICD-10-CM

## 2017-04-03 DIAGNOSIS — M545 Low back pain: Secondary | ICD-10-CM | POA: Diagnosis not present

## 2017-04-03 DIAGNOSIS — J309 Allergic rhinitis, unspecified: Secondary | ICD-10-CM | POA: Diagnosis not present

## 2017-04-03 DIAGNOSIS — R062 Wheezing: Secondary | ICD-10-CM

## 2017-04-03 MED ORDER — HYDROCHLOROTHIAZIDE 12.5 MG PO TABS: 12.5000 mg | ORAL_TABLET | Freq: Every day | ORAL | 3 refills | Status: DC

## 2017-04-03 MED ORDER — BLOOD PRESSURE KIT DEVI: 1.0000 [IU] | 0 refills | Status: AC

## 2017-04-03 MED ORDER — ALBUTEROL SULFATE 108 (90 BASE) MCG/ACT IN AEPB: 1.0000 | INHALATION_SPRAY | RESPIRATORY_TRACT | 1 refills | Status: AC | PRN

## 2017-04-03 MED ORDER — BENAZEPRIL HCL 20 MG PO TABS: 20.0000 mg | ORAL_TABLET | Freq: Two times a day (BID) | ORAL | 3 refills | Status: DC

## 2017-04-03 MED ORDER — METOPROLOL TARTRATE 50 MG PO TABS: 50.0000 mg | ORAL_TABLET | Freq: Two times a day (BID) | ORAL | 3 refills | Status: DC

## 2017-04-03 MED ORDER — ATORVASTATIN CALCIUM 40 MG PO TABS: ORAL_TABLET | ORAL | 3 refills | Status: DC

## 2017-04-03 MED ORDER — FLUTICASONE PROPIONATE 50 MCG/ACT NA SUSP: 2.0000 | Freq: Two times a day (BID) | NASAL | 6 refills | Status: AC

## 2017-04-03 MED ORDER — LEVOTHYROXINE SODIUM 75 MCG PO TABS: 75.0000 ug | ORAL_TABLET | Freq: Every day | ORAL | 3 refills | Status: DC

## 2017-04-03 MED ORDER — CELECOXIB 200 MG PO CAPS: 200.0000 mg | ORAL_CAPSULE | Freq: Every day | ORAL | 0 refills | Status: DC

## 2017-04-03 NOTE — Patient Instructions (Addendum)
Your blood pressure is well controlled today.  I recommend continue medications as they are.  I have given you a prescription for a blood pressure monitor.  I recommend you check your blood pressure a few times a week and document these values.  Your goal is less than 130/90.  If your values are consistently above this please return to office for further evaluation.  In terms of your hypothyroidism, I have pending future blood work for you to have your TSH drawn.  Please return in about a month for lab only visit. Your X-ray did show that you have some degenerative changes in your low back.  It also showed some calcifications in your arteries.  For your pain, we are going to start Celebrex 200 mg once daily as needed for pain.  This medication can affect your kidney function.  Therefore try not to use every single day.  If you are going to be on this medication long-term he will need closer follow-up for labs as we will need to monitor your kidney function because it is already elevated.  Considering your x-ray did show calcifications in your arteries, it is really important that we gain maximum control over your cholesterol and blood pressure.  Therefore continue your medications.  I recommend you still continue your Lipitor as prescribed daily.  Please try to follow-up in 6 months for repeat labs when you are fasting.  Thank you for letting me participate in your health and well being.  How to Take Your Blood Pressure You can take your blood pressure at home with a machine. You may need to check your blood pressure at home:  To check if you have high blood pressure (hypertension).  To check your blood pressure over time.  To make sure your blood pressure medicine is working.  Supplies needed: You will need a blood pressure machine, or monitor. You can buy one at a drugstore or online. When choosing one:  Choose one with an arm cuff.  Choose one that wraps around your upper arm. Only one  finger should fit between your arm and the cuff.  Do not choose one that measures your blood pressure from your wrist or finger.  Your doctor can suggest a monitor. How to prepare Avoid these things for 30 minutes before checking your blood pressure:  Drinking caffeine.  Drinking alcohol.  Eating.  Smoking.  Exercising.  Five minutes before checking your blood pressure:  Pee.  Sit in a dining chair. Avoid sitting in a soft couch or armchair.  Be quiet. Do not talk.  How to take your blood pressure Follow the instructions that came with your machine. If you have a digital blood pressure monitor, these may be the instructions: 1. Sit up straight. 2. Place your feet on the floor. Do not cross your ankles or legs. 3. Rest your left arm at the level of your heart. You may rest it on a table, desk, or chair. 4. Pull up your shirt sleeve. 5. Wrap the blood pressure cuff around the upper part of your left arm. The cuff should be 1 inch (2.5 cm) above your elbow. It is best to wrap the cuff around bare skin. 6. Fit the cuff snugly around your arm. You should be able to place only one finger between the cuff and your arm. 7. Put the cord inside the groove of your elbow. 8. Press the power button. 9. Sit quietly while the cuff fills with air and loses air. 10.  Write down the numbers on the screen. 11. Wait 2-3 minutes and then repeat steps 1-10.  What do the numbers mean? Two numbers make up your blood pressure. The first number is called systolic pressure. The second is called diastolic pressure. An example of a blood pressure reading is "120 over 80" (or 120/80). If you are an adult and do not have a medical condition, use this guide to find out if your blood pressure is normal: Normal  First number: below 120.  Second number: below 80. Elevated  First number: 120-129.  Second number: below 80. Hypertension stage 1  First number: 130-139.  Second number:  80-89. Hypertension stage 2  First number: 140 or above.  Second number: 90 or above. Your blood pressure is above normal even if only the top or bottom number is above normal. Follow these instructions at home:  Check your blood pressure as often as your doctor tells you to.  Take your monitor to your next doctor's appointment. Your doctor will: ? Make sure you are using it correctly. ? Make sure it is working right.  Make sure you understand what your blood pressure numbers should be.  Tell your doctor if your medicines are causing side effects. Contact a doctor if:  Your blood pressure keeps being high. Get help right away if:  Your first blood pressure number is higher than 180.  Your second blood pressure number is higher than 120. This information is not intended to replace advice given to you by your health care provider. Make sure you discuss any questions you have with your health care provider. Document Released: 04/30/2008 Document Revised: 04/15/2016 Document Reviewed: 10/25/2015 Elsevier Interactive Patient Education  2018 [employer].   Atherosclerosis Atherosclerosis is narrowing and hardening of the blood vessels (arteries). Arteries are tubes that carry blood that contains oxygen from the heart to all parts of the body. Arteries can become narrow or clogged with a buildup of fat, cholesterol, calcium, or other substances (plaque). Plaque decreases the amount of blood that can flow through the artery. Atherosclerosis can affect any artery in the body, including:  Heart arteries (coronary artery disease), which may cause heart attack.  Brain arteries, which may cause stroke.  Leg, arm, and pelvis arteries (peripheral artery disease), which may cause pain and numbness.  Kidney arteries, which may cause kidney (renal) failure.  Treatment may slow the disease and prevent further damage to the heart, brain, peripheral arteries, and kidneys. What are the  causes? Atherosclerosis develops when plaque forms in an artery. This damages the inside wall of the artery. Over time, the plaque grows and hardens. It may break through the artery wall. This causes a blood clot to form over the break, which narrows the artery more. The clot may also break loose and travel to other arteries, causing more damage. What increases the risk? This condition is more likely to develop in people who:  Are middle age or older.  Have a family history of atherosclerosis.  Have high cholesterol.  Have high blood fats (triglycerides).  Have diabetes.  Are overweight.  Smoke tobacco.  Do not exercise enough.  Have a substance in the blood that indicates increased levels of inflammation in the body (C-reactive protein, or CRP).  Have sleep apnea.  Are stressed.  Drink too much alcohol.  What are the signs or symptoms? This condition may not cause any symptoms. If you do have symptoms, they are caused by damage to an area of your body  that is not getting enough blood. The following symptoms are possible:  Coronary artery disease may cause chest pain and shortness of breath.  Decreased blood supply to your brain may cause a stroke. Signs and symptoms of stroke may include sudden: ? Weakness on one side of the body. ? Confusion. ? Changes in vision. ? Inability to speak or understand speech. ? Loss of balance, coordination, or ability to walk. ? Severe headache. ? Loss of consciousness.  Peripheral artery disease may cause pain and numbness, often in the legs and hips.  Renal failure may cause fatigue, nausea, swelling, and itchy skin.  How is this diagnosed? This condition is diagnosed based on your medical history and a physical exam. During the exam, your health care provider will check your pulses and listen for a "whooshing" sound over your arteries (bruit). You may have tests, such as:  Blood tests to check your levels of cholesterol,  triglycerides, and CRP.  Electrocardiogram (ECG) to check for heart damage.  Chest X-ray to see if your heart is enlarged, which is a sign of heart failure.  Stress test to see how your heart reacts to exercise.  Echocardiogram to get images of the inside of your heart.  Ankle-Brachial index to compare blood pressure in your arms to blood pressure in your ankles.  Ultrasound of your peripheral arteries to check blood flow.  CT scan to check for damage to your heart or brain.  X-rays of blood vessels after dye has been injected (angiogram) to check blood flow.  How is this treated? Treatment starts with lifestyle changes, which may include:  Changing your diet.  Losing weight.  Reducing stress.  Exercising and being more physically active.  Not smoking.  You also may need medicine to:  Lower triglycerides and cholesterol.  Lower and control blood pressure.  Prevent blood clots.  Lower inflammation in your body.  Lower and control your blood sugar.  Sometimes, surgery is needed to remove plaque, widen your artery, or create a new path for your blood (bypass). Surgical treatment may include:  Removing plaque from an artery (endarterectomy).  Opening a narrowed heart artery (angioplasty).  Heart or peripheral artery bypass graft surgery.  Follow these instructions at home: Lifestyle   Eat a heart-healthy diet. Talk to your health care provider or a diet specialist (dietitian) if you need help. A heart-healthy diet includes: ? Limiting unhealthy fats and increasing healthy fats. Some examples of healthy fats are olive oil and canola oil. ? Eating plant-based foods, such as fruits, vegetables, nuts, legumes, and whole grains.  Follow an exercise program as told by your health care provider.  Maintain a healthy weight. Lose weight if directed by your health care provider.  Rest when you are tired.  Learn to manage your stress.  Do not use any tobacco  products, such as cigarettes, chewing tobacco, and e-cigarettes. If you need help quitting, ask your health care provider.  Limit alcohol intake to no more than 1 drink a day for nonpregnant women and 2 drinks a day for men. One drink equals 12 oz of beer, 5 oz of wine, or 1 oz of hard liquor.  Do not abuse drugs. General instructions  Take over-the-counter and prescription medicines only as told by your health care provider.  Manage other health conditions as told by your health care provider.  Keep all follow-up visits as told by your health care provider. This is important. Contact a health care provider if:  You have  chest pain or discomfort. This includes squeezing chest pain that may feel like indigestion (angina).  You have shortness of breath.  You have an irregular heartbeat.  You have unexplained fatigue.  You have unexplained pain or numbness in an arm, leg, or hip.  You have nausea, swelling of your hands or feet, and itchy skin. Get help right away if:  You have symptoms of a heart attack, such as: ? Chest pain. ? Shortness of breath. ? Pain in your neck, jaw, arms, back, or stomach. ? Cold sweat. ? Nausea. ? Light-headedness.  You have symptoms of a stroke, such as sudden: ? Weakness on one side of your body. ? Confusion. ? Changes in vision. ? Inability to speak or understand speech. ? Loss of balance, coordination, or ability to walk. ? Severe headache. ? Loss of consciousness. These symptoms may represent a serious problem that is an emergency. Do not wait to see if the symptoms will go away. Get medical help right away. Call your local emergency services (911 in the [place] Do not drive yourself to the hospital. This information is not intended to replace advice given to you by your health care provider. Make sure you discuss any questions you have with your health care provider. Document Released: 08/08/2003 Document Revised: 10/24/2015 Document  Reviewed: 04/08/2015 Elsevier Interactive Patient Education  2018 [employer].   IF you received an x-ray today, you will receive an invoice from [facility]. Please contact [facility] at [phone] with questions or concerns regarding your invoice.   IF you received labwork today, you will receive an invoice from [facility]. Please contact [facility] at [phone] with questions or concerns regarding your invoice.   Our billing staff will not be able to assist you with questions regarding bills from these companies.  You will be contacted with the lab results as soon as they are available. The fastest way to get your results is to activate your My Chart account. Instructions are located on the last page of this paperwork. If you have not heard from [place] regarding the results in 2 weeks, please contact this office.
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
## 2017-04-04 LAB — CMP14+EGFR
A/G RATIO: 2 (ref 1.2–2.2)
ALT: 13 IU/L (ref 0–32)
AST: 30 IU/L (ref 0–40)
Albumin: 4.7 g/dL (ref 3.6–4.8)
Alkaline Phosphatase: 78 IU/L (ref 39–117)
BILIRUBIN TOTAL: 0.5 mg/dL (ref 0.0–1.2)
BUN/Creatinine Ratio: 18 (ref 12–28)
BUN: 30 mg/dL — AB (ref 8–27)
CHLORIDE: 101 mmol/L (ref 96–106)
CO2: 17 mmol/L — ABNORMAL LOW (ref 20–29)
Calcium: 9.7 mg/dL (ref 8.7–10.3)
Creatinine, Ser: 1.66 mg/dL — ABNORMAL HIGH (ref 0.57–1.00)
GFR calc non Af Amer: 31 mL/min/{1.73_m2} — ABNORMAL LOW (ref >59)
GFR, EST AFRICAN AMERICAN: 36 mL/min/{1.73_m2} — AB (ref >59)
GLUCOSE: 77 mg/dL (ref 65–99)
Globulin, Total: 2.4 g/dL (ref 1.5–4.5)
POTASSIUM: 5.6 mmol/L — AB (ref 3.5–5.2)
Sodium: 138 mmol/L (ref 134–144)
TOTAL PROTEIN: 7.1 g/dL (ref 6.0–8.5)

## 2017-04-04 LAB — CBC WITH DIFFERENTIAL/PLATELET
BASOS ABS: 0.1 10*3/uL (ref 0.0–0.2)
Basos: 1 %
EOS (ABSOLUTE): 0.1 10*3/uL (ref 0.0–0.4)
Eos: 3 %
HEMOGLOBIN: 12.1 g/dL (ref 11.1–15.9)
Hematocrit: 36 % (ref 34.0–46.6)
IMMATURE GRANS (ABS): 0 10*3/uL (ref 0.0–0.1)
IMMATURE GRANULOCYTES: 0 %
LYMPHS: 22 %
Lymphocytes Absolute: 1.2 10*3/uL (ref 0.7–3.1)
MCH: 34.7 pg — ABNORMAL HIGH (ref 26.6–33.0)
MCHC: 33.6 g/dL (ref 31.5–35.7)
MCV: 103 fL — AB (ref 79–97)
MONOCYTES: 9 %
Monocytes Absolute: 0.5 10*3/uL (ref 0.1–0.9)
NEUTROS ABS: 3.7 10*3/uL (ref 1.4–7.0)
NEUTROS PCT: 65 %
PLATELETS: 208 10*3/uL (ref 150–379)
RBC: 3.49 x10E6/uL — AB (ref 3.77–5.28)
RDW: 14.5 % (ref 12.3–15.4)
WBC: 5.6 10*3/uL (ref 3.4–10.8)

## 2017-04-04 LAB — LIPID PANEL
CHOL/HDL RATIO: 3.2 ratio (ref 0.0–4.4)
CHOLESTEROL TOTAL: 317 mg/dL — AB (ref 100–199)
HDL: 100 mg/dL (ref >39)
LDL Calculated: 144 mg/dL — ABNORMAL HIGH (ref 0–99)
TRIGLYCERIDES: 364 mg/dL — AB (ref 0–149)
VLDL Cholesterol Cal: 73 mg/dL — ABNORMAL HIGH (ref 5–40)

## 2017-04-06 ENCOUNTER — Other Ambulatory Visit: Payer: Self-pay | Admitting: Physician Assistant

## 2017-04-06 DIAGNOSIS — R944 Abnormal results of kidney function studies: Secondary | ICD-10-CM

## 2017-04-06 DIAGNOSIS — E875 Hyperkalemia: Secondary | ICD-10-CM

## 2017-04-06 MED ORDER — FUROSEMIDE 20 MG PO TABS: 20.0000 mg | ORAL_TABLET | Freq: Once | ORAL | 0 refills | Status: DC

## 2017-04-06 MED ORDER — KAYEXALATE PO POWD: 15.0000 g | Freq: Every day | ORAL | 0 refills | Status: AC

## 2017-04-06 NOTE — Progress Notes (Signed)
Meds ordered this encounter  Medications  . Sodium Polystyrene Sulfonate (KAYEXALATE) POWD    Sig: Take 15 g daily for 2 days by mouth.    Dispense:  30 g    Refill:  0    Order Specific Question:   Supervising Provider    Answer:   Ethelda ChickSMITH, KRISTI M [[id number]]  . furosemide (LASIX) 20 MG tablet    Sig: Take 1 tablet (20 mg total) once for 1 dose by mouth.    Dispense:  1 tablet    Refill:  0    Order Specific Question:   Supervising Provider    Answer:   Ethelda ChickSMITH, KRISTI M [[id number]]

## 2017-04-07 ENCOUNTER — Ambulatory Visit (INDEPENDENT_AMBULATORY_CARE_PROVIDER_SITE_OTHER): Payer: PPO | Admitting: Physician Assistant

## 2017-04-07 ENCOUNTER — Telehealth: Payer: Self-pay | Admitting: Physician Assistant

## 2017-04-07 ENCOUNTER — Encounter: Payer: Self-pay | Admitting: Physician Assistant

## 2017-04-07 DIAGNOSIS — E875 Hyperkalemia: Secondary | ICD-10-CM | POA: Diagnosis not present

## 2017-04-07 DIAGNOSIS — R944 Abnormal results of kidney function studies: Secondary | ICD-10-CM

## 2017-04-07 DIAGNOSIS — E039 Hypothyroidism, unspecified: Secondary | ICD-10-CM | POA: Diagnosis not present

## 2017-04-07 DIAGNOSIS — R7989 Other specified abnormal findings of blood chemistry: Secondary | ICD-10-CM

## 2017-04-07 NOTE — Progress Notes (Signed)
I did not personally examine this patient.  

## 2017-04-07 NOTE — Telephone Encounter (Signed)
I contacted patient and informed her of CMP results.  Her potassium has improved and is within normal range. She does not need to take Kayexalate or Lasix anymore.  Her GFR has improved slightly and her creatinine has also improved slightly.  I recommended she still stay off of her hydrochlorthiazide, benazepril, and Celebrex.  Plan to follow-up in 1 week for repeat labs.  In the meantime, recommended to check blood pressure daily.  Goal is less than 140/90. Encouraged to please contact the office if bp values are consistently above this or she starts to develop any concerning symptoms.

## 2017-04-12 ENCOUNTER — Other Ambulatory Visit: Payer: Self-pay | Admitting: Physician Assistant

## 2017-04-12 DIAGNOSIS — R944 Abnormal results of kidney function studies: Secondary | ICD-10-CM

## 2017-04-14 ENCOUNTER — Ambulatory Visit (INDEPENDENT_AMBULATORY_CARE_PROVIDER_SITE_OTHER): Payer: PPO | Admitting: Family Medicine

## 2017-04-14 DIAGNOSIS — R944 Abnormal results of kidney function studies: Secondary | ICD-10-CM

## 2017-04-14 LAB — CMP14+EGFR
A/G RATIO: 2 (ref 1.2–2.2)
ALT: 15 IU/L (ref 0–32)
AST: 24 IU/L (ref 0–40)
Albumin: 4.7 g/dL (ref 3.6–4.8)
Alkaline Phosphatase: 73 IU/L (ref 39–117)
BUN/Creatinine Ratio: 16 (ref 12–28)
BUN: 20 mg/dL (ref 8–27)
Bilirubin Total: 0.5 mg/dL (ref 0.0–1.2)
CALCIUM: 9.9 mg/dL (ref 8.7–10.3)
CO2: 21 mmol/L (ref 20–29)
CREATININE: 1.22 mg/dL — AB (ref 0.57–1.00)
Chloride: 105 mmol/L (ref 96–106)
GFR, EST AFRICAN AMERICAN: 52 mL/min/{1.73_m2} — AB (ref >59)
GFR, EST NON AFRICAN AMERICAN: 45 mL/min/{1.73_m2} — AB (ref >59)
GLUCOSE: 103 mg/dL — AB (ref 65–99)
Globulin, Total: 2.3 g/dL (ref 1.5–4.5)
Potassium: 4.8 mmol/L (ref 3.5–5.2)
Sodium: 142 mmol/L (ref 134–144)
TOTAL PROTEIN: 7 g/dL (ref 6.0–8.5)

## 2017-04-14 NOTE — Progress Notes (Signed)
Patient came in for a fast track blood draw.  The patient requested for the nursing staff to check her blood pressure.  Patient was advised if it was high she would be advised to remain in the office for a recheck or to have an provider to review her numbers.    After checking the patient's blood pressure in her left arm which was 174/81 patient was advised to remain in the office.  Patient stated, "I don't have time to wait."  I, Lorina Rabonavina Lissy Deuser, CMA advised the patient I would talk to the provider to determine the next steps.  The patient was then taken to an closed room to obtain a second reading.  The blood pressure was taken in the same left arm with an extra small cuff and her reading was 170/82.  Patient was given the opportunity to be seen as a same day office appointment but patient refused.  She was given the opportunity to follow up with an appointment with her PCP Terry Allison.  This of which she did agree to.

## 2017-04-15 ENCOUNTER — Other Ambulatory Visit: Payer: Self-pay

## 2017-04-15 ENCOUNTER — Ambulatory Visit: Payer: PPO | Admitting: Physician Assistant

## 2017-04-15 ENCOUNTER — Encounter: Payer: Self-pay | Admitting: Physician Assistant

## 2017-04-15 VITALS — BP 160/78 | HR 50 | Temp 97.8°F | Resp 18 | Ht 59.53 in | Wt 114.4 lb

## 2017-04-15 DIAGNOSIS — R946 Abnormal results of thyroid function studies: Secondary | ICD-10-CM

## 2017-04-15 DIAGNOSIS — I10 Essential (primary) hypertension: Secondary | ICD-10-CM

## 2017-04-15 MED ORDER — AMLODIPINE BESYLATE 5 MG PO TABS: 5.0000 mg | ORAL_TABLET | Freq: Every day | ORAL | 1 refills | Status: DC

## 2017-04-15 NOTE — Patient Instructions (Addendum)
  In terms of elevated blood pressure, we are going to add a new medication to your regimen.  The medication is called amlodipine.  Please take as prescribed. Please continue not taking the Benazepril, Hydrochlorothiazide and Celecoxib until further evaluated by Nephrology  Try to check your blood pressure outside of the office.  Your goal is less than 140/90.  If your values are consistently above this goal, please return to office for further evaluation. If you start to have chest pain, blurred vision, shortness of breath, severe headache, lower leg swelling, or nausea/vomiting please seek care immediately here or at the ED.   Return in 6 weeks for reevaluation and repeat blood work.  In the meantime you should be contacted by [facility].  Please make sure you go to that appointment.  Thank you for letting me participate in your health and well being.    IF you received an x-ray today, you will receive an invoice from [facility]. Please contact [facility] at [phone] with questions or concerns regarding your invoice.   IF you received labwork today, you will receive an invoice from [facility]. Please contact [facility] at [phone] with questions or concerns regarding your invoice.   Our billing staff will not be able to assist you with questions regarding bills from these companies.  You will be contacted with the lab results as soon as they are available. The fastest way to get your results is to activate your My Chart account. Instructions are located on the last page of this paperwork. If you have not heard from us regarding the results in 2 weeks, please contact this office.
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
## 2017-04-16 ENCOUNTER — Other Ambulatory Visit: Payer: Self-pay | Admitting: Physician Assistant

## 2017-04-16 DIAGNOSIS — R7989 Other specified abnormal findings of blood chemistry: Secondary | ICD-10-CM

## 2017-04-16 LAB — CMP14+EGFR
A/G RATIO: 2.2 (ref 1.2–2.2)
ALBUMIN: 5.1 g/dL — AB (ref 3.6–4.8)
ALT: 16 IU/L (ref 0–32)
AST: 28 IU/L (ref 0–40)
Alkaline Phosphatase: 72 IU/L (ref 39–117)
BILIRUBIN TOTAL: 0.3 mg/dL (ref 0.0–1.2)
BUN / CREAT RATIO: 22 (ref 12–28)
BUN: 33 mg/dL — AB (ref 8–27)
CALCIUM: 9.3 mg/dL (ref 8.7–10.3)
CO2: 23 mmol/L (ref 20–29)
Chloride: 94 mmol/L — ABNORMAL LOW (ref 96–106)
Creatinine, Ser: 1.53 mg/dL — ABNORMAL HIGH (ref 0.57–1.00)
GFR, EST AFRICAN AMERICAN: 40 mL/min/{1.73_m2} — AB (ref >59)
GFR, EST NON AFRICAN AMERICAN: 34 mL/min/{1.73_m2} — AB (ref >59)
GLUCOSE: 86 mg/dL (ref 65–99)
Globulin, Total: 2.3 g/dL (ref 1.5–4.5)
Potassium: 4.2 mmol/L (ref 3.5–5.2)
Sodium: 135 mmol/L (ref 134–144)
TOTAL PROTEIN: 7.4 g/dL (ref 6.0–8.5)

## 2017-04-16 LAB — CK: CK TOTAL: 155 U/L (ref 24–173)

## 2017-04-16 LAB — TSH+T3+FREE T4+T3 FREE
FREE T-3: 1.5 pg/mL — AB
FREE T4 BY DIALYSIS: 0.77 ng/dL — AB
TRIIODOTHYRONINE (T-3), SERUM: 49 ng/dL — AB
TSH-ICMA: 139 uU/mL — AB

## 2017-04-16 NOTE — Progress Notes (Signed)
Orders Placed This Encounter  Procedures  . CMP14+EGFR    Standing Status:   Future    Standing Expiration Date:   04/30/2017

## 2017-04-28 DIAGNOSIS — M4316 Spondylolisthesis, lumbar region: Secondary | ICD-10-CM | POA: Diagnosis not present

## 2017-04-28 DIAGNOSIS — I10 Essential (primary) hypertension: Secondary | ICD-10-CM | POA: Diagnosis not present

## 2017-05-19 ENCOUNTER — Other Ambulatory Visit: Payer: Self-pay | Admitting: Neurological Surgery

## 2017-05-19 DIAGNOSIS — M4316 Spondylolisthesis, lumbar region: Secondary | ICD-10-CM

## 2017-05-21 ENCOUNTER — Telehealth: Payer: Self-pay

## 2017-05-21 NOTE — Telephone Encounter (Signed)
[facility] with [facility] [address]  [place] do not dispense BP cuffs (ie medical supplies) from the pharmacy.  Pt will have to purchase from the "front of the store".  Spoke to pt and advised.  She has not purchased one at time of phone call. Verbalized understanding.

## 2017-05-21 NOTE — Telephone Encounter (Signed)
-----   Message from Magdalene RiverBrittany D Wiseman, PA-C sent at 05/20/2017  7:59 PM EST ----- Can you please call patient's pharmacy.  I received a letter from [facility] noting that they were unable to provide patient with a blood pressure monitor however I am unclear as to what they need from me so they can fill this prescription.  Please let me know thank you.

## 2017-05-27 ENCOUNTER — Emergency Department (HOSPITAL_COMMUNITY): Payer: PPO

## 2017-05-27 ENCOUNTER — Encounter (HOSPITAL_COMMUNITY): Payer: Self-pay | Admitting: Emergency Medicine

## 2017-05-27 ENCOUNTER — Emergency Department (HOSPITAL_COMMUNITY)
Admission: EM | Admit: 2017-05-27 | Discharge: 2017-05-27 | Disposition: A | Discharge status: Home / Self Care | Payer: PPO | Attending: Emergency Medicine | Admitting: Emergency Medicine

## 2017-05-27 DIAGNOSIS — I10 Essential (primary) hypertension: Secondary | ICD-10-CM | POA: Diagnosis not present

## 2017-05-27 DIAGNOSIS — Z79899 Other long term (current) drug therapy: Secondary | ICD-10-CM | POA: Diagnosis not present

## 2017-05-27 DIAGNOSIS — E039 Hypothyroidism, unspecified: Secondary | ICD-10-CM | POA: Insufficient documentation

## 2017-05-27 DIAGNOSIS — F410 Panic disorder [episodic paroxysmal anxiety] without agoraphobia: Secondary | ICD-10-CM | POA: Insufficient documentation

## 2017-05-27 DIAGNOSIS — Z7982 Long term (current) use of aspirin: Secondary | ICD-10-CM | POA: Insufficient documentation

## 2017-05-27 DIAGNOSIS — Z87891 Personal history of nicotine dependence: Secondary | ICD-10-CM | POA: Insufficient documentation

## 2017-05-27 DIAGNOSIS — J45909 Unspecified asthma, uncomplicated: Secondary | ICD-10-CM | POA: Diagnosis not present

## 2017-05-27 DIAGNOSIS — R202 Paresthesia of skin: Secondary | ICD-10-CM | POA: Diagnosis not present

## 2017-05-27 DIAGNOSIS — R0602 Shortness of breath: Secondary | ICD-10-CM | POA: Diagnosis not present

## 2017-05-27 DIAGNOSIS — J449 Chronic obstructive pulmonary disease, unspecified: Secondary | ICD-10-CM | POA: Diagnosis not present

## 2017-05-27 LAB — CBC WITH DIFFERENTIAL/PLATELET
Basophils Absolute: 0 10*3/uL (ref 0.0–0.1)
Basophils Relative: 0 %
Eosinophils Absolute: 0.1 10*3/uL (ref 0.0–0.7)
Eosinophils Relative: 2 %
HEMATOCRIT: 37 % (ref 36.0–46.0)
HEMOGLOBIN: 12.1 g/dL (ref 12.0–15.0)
LYMPHS ABS: 1.9 10*3/uL (ref 0.7–4.0)
LYMPHS PCT: 34 %
MCH: 35.5 pg — ABNORMAL HIGH (ref 26.0–34.0)
MCHC: 32.7 g/dL (ref 30.0–36.0)
MCV: 108.5 fL — AB (ref 78.0–100.0)
MONO ABS: 0.6 10*3/uL (ref 0.1–1.0)
MONOS PCT: 12 %
NEUTROS ABS: 2.9 10*3/uL (ref 1.7–7.7)
NEUTROS PCT: 52 %
Platelets: 216 10*3/uL (ref 150–400)
RBC: 3.41 MIL/uL — ABNORMAL LOW (ref 3.87–5.11)
RDW: 15.2 % (ref 11.5–15.5)
WBC: 5.5 10*3/uL (ref 4.0–10.5)

## 2017-05-27 LAB — TROPONIN I: Troponin I: <0.03 ng/mL (ref <0.03)

## 2017-05-27 LAB — CBG MONITORING, ED: Glucose-Capillary: 94 mg/dL (ref 65–99)

## 2017-05-27 LAB — BASIC METABOLIC PANEL
ANION GAP: 10 (ref 5–15)
BUN: 20 mg/dL (ref 6–20)
CHLORIDE: 107 mmol/L (ref 101–111)
CO2: 22 mmol/L (ref 22–32)
Calcium: 9.3 mg/dL (ref 8.9–10.3)
Creatinine, Ser: 1.27 mg/dL — ABNORMAL HIGH (ref 0.44–1.00)
GFR calc non Af Amer: 42 mL/min — ABNORMAL LOW (ref >60)
GFR, EST AFRICAN AMERICAN: 48 mL/min — AB (ref >60)
GLUCOSE: 105 mg/dL — AB (ref 65–99)
POTASSIUM: 4.2 mmol/L (ref 3.5–5.1)
Sodium: 139 mmol/L (ref 135–145)

## 2017-05-27 MED ORDER — LORAZEPAM 1 MG PO TABS: 1.0000 mg | ORAL_TABLET | Freq: Three times a day (TID) | ORAL | 0 refills | Status: AC | PRN
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
## 2017-05-27 NOTE — ED Notes (Signed)
ED Provider at bedside. 

## 2017-06-09 ENCOUNTER — Ambulatory Visit
Admission: RE | Admit: 2017-06-09 | Discharge: 2017-06-09 | Disposition: A | Discharge status: Home / Self Care | Payer: PPO | Source: Ambulatory Visit | Attending: Neurological Surgery | Admitting: Neurological Surgery

## 2017-06-09 DIAGNOSIS — M48061 Spinal stenosis, lumbar region without neurogenic claudication: Secondary | ICD-10-CM | POA: Diagnosis not present

## 2017-06-09 DIAGNOSIS — M4316 Spondylolisthesis, lumbar region: Secondary | ICD-10-CM

## 2017-06-10 DIAGNOSIS — M47892 Other spondylosis, cervical region: Secondary | ICD-10-CM | POA: Diagnosis not present

## 2017-06-10 DIAGNOSIS — M48062 Spinal stenosis, lumbar region with neurogenic claudication: Secondary | ICD-10-CM | POA: Diagnosis not present

## 2017-06-14 ENCOUNTER — Other Ambulatory Visit: Payer: Self-pay | Admitting: Neurological Surgery

## 2017-06-14 DIAGNOSIS — M47892 Other spondylosis, cervical region: Secondary | ICD-10-CM

## 2017-06-17 ENCOUNTER — Other Ambulatory Visit: Payer: Self-pay | Admitting: Physician Assistant

## 2017-06-17 DIAGNOSIS — J309 Allergic rhinitis, unspecified: Secondary | ICD-10-CM

## 2017-10-02 ENCOUNTER — Other Ambulatory Visit: Payer: Self-pay | Admitting: Physician Assistant

## 2017-10-02 DIAGNOSIS — I10 Essential (primary) hypertension: Secondary | ICD-10-CM

## 2017-10-14 DIAGNOSIS — M545 Low back pain: Secondary | ICD-10-CM | POA: Diagnosis not present

## 2017-10-14 DIAGNOSIS — I10 Essential (primary) hypertension: Secondary | ICD-10-CM | POA: Diagnosis not present

## 2017-10-14 DIAGNOSIS — D649 Anemia, unspecified: Secondary | ICD-10-CM | POA: Diagnosis not present

## 2017-10-14 DIAGNOSIS — Z0000 Encounter for general adult medical examination without abnormal findings: Secondary | ICD-10-CM | POA: Diagnosis not present

## 2017-10-14 DIAGNOSIS — F1020 Alcohol dependence, uncomplicated: Secondary | ICD-10-CM | POA: Diagnosis not present

## 2017-10-14 DIAGNOSIS — K219 Gastro-esophageal reflux disease without esophagitis: Secondary | ICD-10-CM | POA: Diagnosis not present

## 2017-11-16 DIAGNOSIS — M109 Gout, unspecified: Secondary | ICD-10-CM | POA: Diagnosis not present

## 2017-11-16 DIAGNOSIS — K219 Gastro-esophageal reflux disease without esophagitis: Secondary | ICD-10-CM | POA: Diagnosis not present

## 2017-11-16 DIAGNOSIS — I10 Essential (primary) hypertension: Secondary | ICD-10-CM | POA: Diagnosis not present

## 2017-11-16 DIAGNOSIS — T148XXA Other injury of unspecified body region, initial encounter: Secondary | ICD-10-CM | POA: Diagnosis not present

## 2017-12-15 DIAGNOSIS — L0390 Cellulitis, unspecified: Secondary | ICD-10-CM | POA: Diagnosis not present

## 2017-12-15 DIAGNOSIS — M79661 Pain in right lower leg: Secondary | ICD-10-CM | POA: Diagnosis not present

## 2017-12-15 DIAGNOSIS — S81801A Unspecified open wound, right lower leg, initial encounter: Secondary | ICD-10-CM | POA: Diagnosis not present

## 2017-12-15 DIAGNOSIS — L03115 Cellulitis of right lower limb: Secondary | ICD-10-CM | POA: Diagnosis not present

## 2017-12-20 DIAGNOSIS — F1020 Alcohol dependence, uncomplicated: Secondary | ICD-10-CM | POA: Diagnosis not present

## 2017-12-20 DIAGNOSIS — L0390 Cellulitis, unspecified: Secondary | ICD-10-CM | POA: Diagnosis not present

## 2017-12-20 DIAGNOSIS — S81801D Unspecified open wound, right lower leg, subsequent encounter: Secondary | ICD-10-CM | POA: Diagnosis not present

## 2017-12-20 DIAGNOSIS — M79661 Pain in right lower leg: Secondary | ICD-10-CM | POA: Diagnosis not present

## 2017-12-20 DIAGNOSIS — I10 Essential (primary) hypertension: Secondary | ICD-10-CM | POA: Diagnosis not present

## 2017-12-23 DIAGNOSIS — I10 Essential (primary) hypertension: Secondary | ICD-10-CM | POA: Diagnosis not present

## 2017-12-23 DIAGNOSIS — M79661 Pain in right lower leg: Secondary | ICD-10-CM | POA: Diagnosis not present

## 2017-12-23 DIAGNOSIS — F1020 Alcohol dependence, uncomplicated: Secondary | ICD-10-CM | POA: Diagnosis not present

## 2017-12-23 DIAGNOSIS — T148XXD Other injury of unspecified body region, subsequent encounter: Secondary | ICD-10-CM | POA: Diagnosis not present

## 2017-12-23 DIAGNOSIS — G629 Polyneuropathy, unspecified: Secondary | ICD-10-CM | POA: Diagnosis not present

## 2017-12-27 DIAGNOSIS — S81801D Unspecified open wound, right lower leg, subsequent encounter: Secondary | ICD-10-CM | POA: Diagnosis not present

## 2017-12-27 DIAGNOSIS — F1020 Alcohol dependence, uncomplicated: Secondary | ICD-10-CM | POA: Diagnosis not present

## 2017-12-27 DIAGNOSIS — R2241 Localized swelling, mass and lump, right lower limb: Secondary | ICD-10-CM | POA: Diagnosis not present

## 2017-12-27 DIAGNOSIS — G629 Polyneuropathy, unspecified: Secondary | ICD-10-CM | POA: Diagnosis not present

## 2017-12-28 ENCOUNTER — Ambulatory Visit (HOSPITAL_COMMUNITY)
Admission: RE | Admit: 2017-12-28 | Discharge: 2017-12-28 | Disposition: A | Discharge status: Home / Self Care | Payer: PPO | Source: Ambulatory Visit | Attending: Cardiology | Admitting: Cardiology

## 2017-12-28 ENCOUNTER — Other Ambulatory Visit (HOSPITAL_COMMUNITY): Payer: Self-pay | Admitting: Family Medicine

## 2017-12-28 DIAGNOSIS — M79604 Pain in right leg: Secondary | ICD-10-CM

## 2017-12-28 DIAGNOSIS — M7989 Other specified soft tissue disorders: Secondary | ICD-10-CM | POA: Insufficient documentation

## 2017-12-30 DIAGNOSIS — L0390 Cellulitis, unspecified: Secondary | ICD-10-CM | POA: Diagnosis not present

## 2017-12-30 DIAGNOSIS — M79661 Pain in right lower leg: Secondary | ICD-10-CM | POA: Diagnosis not present

## 2017-12-30 DIAGNOSIS — S81801D Unspecified open wound, right lower leg, subsequent encounter: Secondary | ICD-10-CM | POA: Diagnosis not present

## 2017-12-30 DIAGNOSIS — G629 Polyneuropathy, unspecified: Secondary | ICD-10-CM | POA: Diagnosis not present

## 2018-01-03 ENCOUNTER — Other Ambulatory Visit (HOSPITAL_COMMUNITY): Payer: Self-pay | Admitting: Family Medicine

## 2018-01-03 DIAGNOSIS — M79661 Pain in right lower leg: Secondary | ICD-10-CM | POA: Diagnosis not present

## 2018-01-03 DIAGNOSIS — S81801D Unspecified open wound, right lower leg, subsequent encounter: Secondary | ICD-10-CM | POA: Diagnosis not present

## 2018-01-03 DIAGNOSIS — I739 Peripheral vascular disease, unspecified: Secondary | ICD-10-CM

## 2018-01-03 DIAGNOSIS — G629 Polyneuropathy, unspecified: Secondary | ICD-10-CM | POA: Diagnosis not present

## 2018-01-03 DIAGNOSIS — R600 Localized edema: Secondary | ICD-10-CM | POA: Diagnosis not present

## 2018-01-05 ENCOUNTER — Ambulatory Visit (HOSPITAL_COMMUNITY)
Admission: RE | Admit: 2018-01-05 | Discharge: 2018-01-05 | Disposition: A | Discharge status: Home / Self Care | Payer: PPO | Source: Ambulatory Visit | Attending: Internal Medicine | Admitting: Internal Medicine

## 2018-01-05 DIAGNOSIS — I739 Peripheral vascular disease, unspecified: Secondary | ICD-10-CM | POA: Diagnosis not present

## 2018-01-06 ENCOUNTER — Other Ambulatory Visit: Payer: Self-pay | Admitting: Physician Assistant

## 2018-01-06 DIAGNOSIS — I10 Essential (primary) hypertension: Secondary | ICD-10-CM

## 2018-01-06 NOTE — Telephone Encounter (Signed)
Attempted to contact pt regarding prescription refill for amlodipine; per pharmacy note dated 10/04/17, "no more refills without office visit"; left message on voicemail [phone]

## 2018-01-10 DIAGNOSIS — S81801D Unspecified open wound, right lower leg, subsequent encounter: Secondary | ICD-10-CM | POA: Diagnosis not present

## 2018-01-10 DIAGNOSIS — G629 Polyneuropathy, unspecified: Secondary | ICD-10-CM | POA: Diagnosis not present

## 2018-01-10 DIAGNOSIS — R600 Localized edema: Secondary | ICD-10-CM | POA: Diagnosis not present

## 2018-01-10 DIAGNOSIS — I700 Atherosclerosis of aorta: Secondary | ICD-10-CM | POA: Diagnosis not present

## 2018-01-17 DIAGNOSIS — E785 Hyperlipidemia, unspecified: Secondary | ICD-10-CM | POA: Diagnosis not present

## 2018-01-17 DIAGNOSIS — I739 Peripheral vascular disease, unspecified: Secondary | ICD-10-CM | POA: Diagnosis not present

## 2018-01-17 DIAGNOSIS — G64 Other disorders of peripheral nervous system: Secondary | ICD-10-CM | POA: Diagnosis not present

## 2018-01-17 DIAGNOSIS — S81801D Unspecified open wound, right lower leg, subsequent encounter: Secondary | ICD-10-CM | POA: Diagnosis not present

## 2018-01-17 DIAGNOSIS — I10 Essential (primary) hypertension: Secondary | ICD-10-CM | POA: Diagnosis not present

## 2018-01-18 ENCOUNTER — Encounter (HOSPITAL_BASED_OUTPATIENT_CLINIC_OR_DEPARTMENT_OTHER): Payer: PPO | Attending: Internal Medicine

## 2018-01-18 DIAGNOSIS — I10 Essential (primary) hypertension: Secondary | ICD-10-CM | POA: Diagnosis not present

## 2018-01-18 DIAGNOSIS — J449 Chronic obstructive pulmonary disease, unspecified: Secondary | ICD-10-CM | POA: Insufficient documentation

## 2018-01-18 DIAGNOSIS — L97812 Non-pressure chronic ulcer of other part of right lower leg with fat layer exposed: Secondary | ICD-10-CM | POA: Insufficient documentation

## 2018-01-18 DIAGNOSIS — Z87891 Personal history of nicotine dependence: Secondary | ICD-10-CM | POA: Insufficient documentation

## 2018-01-18 DIAGNOSIS — Z8673 Personal history of transient ischemic attack (TIA), and cerebral infarction without residual deficits: Secondary | ICD-10-CM | POA: Insufficient documentation

## 2018-01-18 DIAGNOSIS — I872 Venous insufficiency (chronic) (peripheral): Secondary | ICD-10-CM | POA: Insufficient documentation

## 2018-01-18 DIAGNOSIS — S81801A Unspecified open wound, right lower leg, initial encounter: Secondary | ICD-10-CM | POA: Diagnosis not present

## 2018-01-27 DIAGNOSIS — L97812 Non-pressure chronic ulcer of other part of right lower leg with fat layer exposed: Secondary | ICD-10-CM | POA: Diagnosis not present

## 2018-01-27 DIAGNOSIS — S81801A Unspecified open wound, right lower leg, initial encounter: Secondary | ICD-10-CM | POA: Diagnosis not present

## 2018-02-01 DIAGNOSIS — I739 Peripheral vascular disease, unspecified: Secondary | ICD-10-CM | POA: Diagnosis not present

## 2018-02-01 DIAGNOSIS — G629 Polyneuropathy, unspecified: Secondary | ICD-10-CM | POA: Diagnosis not present

## 2018-02-01 DIAGNOSIS — I10 Essential (primary) hypertension: Secondary | ICD-10-CM | POA: Diagnosis not present

## 2018-02-01 DIAGNOSIS — S81801D Unspecified open wound, right lower leg, subsequent encounter: Secondary | ICD-10-CM | POA: Diagnosis not present

## 2018-02-01 DIAGNOSIS — M545 Low back pain: Secondary | ICD-10-CM | POA: Diagnosis not present

## 2018-02-02 ENCOUNTER — Other Ambulatory Visit: Payer: Self-pay | Admitting: Family Medicine

## 2018-02-02 DIAGNOSIS — E2839 Other primary ovarian failure: Secondary | ICD-10-CM

## 2018-02-02 DIAGNOSIS — Z1231 Encounter for screening mammogram for malignant neoplasm of breast: Secondary | ICD-10-CM

## 2018-02-03 ENCOUNTER — Encounter (HOSPITAL_BASED_OUTPATIENT_CLINIC_OR_DEPARTMENT_OTHER): Payer: PPO | Attending: Internal Medicine

## 2018-02-03 DIAGNOSIS — I872 Venous insufficiency (chronic) (peripheral): Secondary | ICD-10-CM | POA: Insufficient documentation

## 2018-02-03 DIAGNOSIS — L97812 Non-pressure chronic ulcer of other part of right lower leg with fat layer exposed: Secondary | ICD-10-CM | POA: Diagnosis not present

## 2018-02-03 DIAGNOSIS — I10 Essential (primary) hypertension: Secondary | ICD-10-CM | POA: Insufficient documentation

## 2018-02-03 DIAGNOSIS — J449 Chronic obstructive pulmonary disease, unspecified: Secondary | ICD-10-CM | POA: Insufficient documentation

## 2018-02-03 DIAGNOSIS — S81801A Unspecified open wound, right lower leg, initial encounter: Secondary | ICD-10-CM | POA: Diagnosis not present

## 2018-02-10 DIAGNOSIS — S81801A Unspecified open wound, right lower leg, initial encounter: Secondary | ICD-10-CM | POA: Diagnosis not present

## 2018-02-10 DIAGNOSIS — L97812 Non-pressure chronic ulcer of other part of right lower leg with fat layer exposed: Secondary | ICD-10-CM | POA: Diagnosis not present

## 2018-02-16 DIAGNOSIS — L97512 Non-pressure chronic ulcer of other part of right foot with fat layer exposed: Secondary | ICD-10-CM | POA: Diagnosis not present

## 2018-02-16 DIAGNOSIS — I10 Essential (primary) hypertension: Secondary | ICD-10-CM | POA: Diagnosis not present

## 2018-02-16 DIAGNOSIS — L97909 Non-pressure chronic ulcer of unspecified part of unspecified lower leg with unspecified severity: Secondary | ICD-10-CM | POA: Diagnosis not present

## 2018-02-16 DIAGNOSIS — I739 Peripheral vascular disease, unspecified: Secondary | ICD-10-CM | POA: Diagnosis not present

## 2018-02-17 DIAGNOSIS — S81811A Laceration without foreign body, right lower leg, initial encounter: Secondary | ICD-10-CM | POA: Diagnosis not present

## 2018-02-17 DIAGNOSIS — L97812 Non-pressure chronic ulcer of other part of right lower leg with fat layer exposed: Secondary | ICD-10-CM | POA: Diagnosis not present

## 2018-02-24 ENCOUNTER — Other Ambulatory Visit: Payer: Self-pay | Admitting: Physician Assistant

## 2018-02-24 DIAGNOSIS — J309 Allergic rhinitis, unspecified: Secondary | ICD-10-CM

## 2018-03-18 DIAGNOSIS — I10 Essential (primary) hypertension: Secondary | ICD-10-CM | POA: Diagnosis not present

## 2018-03-19 ENCOUNTER — Other Ambulatory Visit: Payer: Self-pay | Admitting: Physician Assistant

## 2018-03-19 DIAGNOSIS — E039 Hypothyroidism, unspecified: Secondary | ICD-10-CM

## 2018-03-21 NOTE — Telephone Encounter (Signed)
Requested medication (s) are due for refill today: no  Requested medication (s) are on the active medication list: yes  Last refill:  04/03/17  Future visit scheduled: no  Notes to clinic:  LOV  04/15/17. Labs overdue.  Requested Prescriptions  Pending Prescriptions Disp Refills   levothyroxine (SYNTHROID, LEVOTHROID) 75 MCG tablet [Pharmacy Med Name: LEVOTHYROXINE 75 MCG TABLET] 90 tablet 3    Sig: TAKE 1 TABLET BY MOUTH EVERY DAY     Endocrinology:  Hypothyroid Agents Failed - 03/19/2018 12:11 AM      Failed - TSH needs to be rechecked within 3 months after an abnormal result. Refill until TSH is due.      Failed - TSH in normal range and within 360 days    TSH  Date Value Ref Range Status  12/16/2016 0.270 (L) 0.450 - 4.500 uIU/mL Final         Passed - Valid encounter within last 12 months    Recent Outpatient Visits          11 months ago Essential hypertension   [facility], [place] D, PA-C   11 months ago Decreased GFR   [facility] at [facility], Manus Rudd, MD   11 months ago Decreased GFR   [facility] at [facility], [place] D, PA-C   11 months ago Essential hypertension   [facility] [place], [place] D, PA-C   1 year ago Essential hypertension   [facility] at [place], [place] D, [place]

## 2018-03-25 ENCOUNTER — Other Ambulatory Visit: Payer: Self-pay | Admitting: Physician Assistant

## 2018-03-25 DIAGNOSIS — Z87891 Personal history of nicotine dependence: Secondary | ICD-10-CM | POA: Diagnosis not present

## 2018-03-25 DIAGNOSIS — I10 Essential (primary) hypertension: Secondary | ICD-10-CM | POA: Diagnosis not present

## 2018-03-25 DIAGNOSIS — E785 Hyperlipidemia, unspecified: Secondary | ICD-10-CM | POA: Diagnosis not present

## 2018-03-25 DIAGNOSIS — I739 Peripheral vascular disease, unspecified: Secondary | ICD-10-CM | POA: Diagnosis not present

## 2018-03-25 DIAGNOSIS — J309 Allergic rhinitis, unspecified: Secondary | ICD-10-CM

## 2018-03-26 DIAGNOSIS — I739 Peripheral vascular disease, unspecified: Secondary | ICD-10-CM | POA: Diagnosis present
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
## 2018-03-29 ENCOUNTER — Encounter (HOSPITAL_COMMUNITY): Payer: Self-pay | Admitting: Cardiology

## 2018-03-29 ENCOUNTER — Ambulatory Visit (HOSPITAL_COMMUNITY)
Admission: RE | Admit: 2018-03-29 | Discharge: 2018-03-29 | Disposition: A | Discharge status: Home / Self Care | Payer: PPO | Source: Ambulatory Visit | Attending: Cardiology | Admitting: Cardiology

## 2018-03-29 ENCOUNTER — Other Ambulatory Visit: Payer: Self-pay

## 2018-03-29 ENCOUNTER — Encounter (HOSPITAL_COMMUNITY)
Admission: RE | Disposition: A | Discharge status: Home / Self Care | Payer: Self-pay | Source: Ambulatory Visit | Attending: Cardiology

## 2018-03-29 DIAGNOSIS — Z8739 Personal history of other diseases of the musculoskeletal system and connective tissue: Secondary | ICD-10-CM | POA: Diagnosis not present

## 2018-03-29 DIAGNOSIS — Z79899 Other long term (current) drug therapy: Secondary | ICD-10-CM | POA: Diagnosis not present

## 2018-03-29 DIAGNOSIS — Z885 Allergy status to narcotic agent status: Secondary | ICD-10-CM | POA: Diagnosis not present

## 2018-03-29 DIAGNOSIS — I739 Peripheral vascular disease, unspecified: Secondary | ICD-10-CM | POA: Diagnosis present

## 2018-03-29 DIAGNOSIS — L97811 Non-pressure chronic ulcer of other part of right lower leg limited to breakdown of skin: Secondary | ICD-10-CM | POA: Insufficient documentation

## 2018-03-29 DIAGNOSIS — E7800 Pure hypercholesterolemia, unspecified: Secondary | ICD-10-CM | POA: Insufficient documentation

## 2018-03-29 DIAGNOSIS — Z823 Family history of stroke: Secondary | ICD-10-CM | POA: Insufficient documentation

## 2018-03-29 DIAGNOSIS — F410 Panic disorder [episodic paroxysmal anxiety] without agoraphobia: Secondary | ICD-10-CM | POA: Diagnosis not present

## 2018-03-29 DIAGNOSIS — Z9889 Other specified postprocedural states: Secondary | ICD-10-CM | POA: Diagnosis not present

## 2018-03-29 DIAGNOSIS — R609 Edema, unspecified: Secondary | ICD-10-CM | POA: Diagnosis not present

## 2018-03-29 DIAGNOSIS — Z8709 Personal history of other diseases of the respiratory system: Secondary | ICD-10-CM | POA: Diagnosis not present

## 2018-03-29 DIAGNOSIS — Z79890 Hormone replacement therapy (postmenopausal): Secondary | ICD-10-CM | POA: Diagnosis not present

## 2018-03-29 DIAGNOSIS — I70238 Atherosclerosis of native arteries of right leg with ulceration of other part of lower right leg: Secondary | ICD-10-CM | POA: Insufficient documentation

## 2018-03-29 DIAGNOSIS — I10 Essential (primary) hypertension: Secondary | ICD-10-CM | POA: Diagnosis not present

## 2018-03-29 DIAGNOSIS — Z87891 Personal history of nicotine dependence: Secondary | ICD-10-CM | POA: Diagnosis not present

## 2018-03-29 DIAGNOSIS — Z7982 Long term (current) use of aspirin: Secondary | ICD-10-CM | POA: Diagnosis not present

## 2018-03-29 DIAGNOSIS — L98491 Non-pressure chronic ulcer of skin of other sites limited to breakdown of skin: Secondary | ICD-10-CM | POA: Diagnosis not present

## 2018-03-29 DIAGNOSIS — Z9851 Tubal ligation status: Secondary | ICD-10-CM | POA: Diagnosis not present

## 2018-03-29 HISTORY — PX: LOWER EXTREMITY ANGIOGRAPHY: CATH118251

## 2018-03-29 SURGERY — LOWER EXTREMITY ANGIOGRAPHY
Anesthesia: LOCAL

## 2018-03-29 MED ORDER — FENTANYL CITRATE (PF) 100 MCG/2ML IJ SOLN
INTRAMUSCULAR | Status: AC
  Filled 2018-03-29: qty 2

## 2018-03-29 MED ORDER — FENTANYL CITRATE (PF) 100 MCG/2ML IJ SOLN
INTRAMUSCULAR | Status: DC | PRN
  Administered 2018-03-29: 50 ug via INTRAVENOUS

## 2018-03-29 MED ORDER — SODIUM CHLORIDE 0.9 % WEIGHT BASED INFUSION: 1.0000 mL/kg/h | INTRAVENOUS | Status: DC

## 2018-03-29 MED ORDER — SODIUM CHLORIDE 0.9% FLUSH: 3.0000 mL | INTRAVENOUS | Status: DC | PRN

## 2018-03-29 MED ORDER — MIDAZOLAM HCL 2 MG/2ML IJ SOLN
INTRAMUSCULAR | Status: AC
  Filled 2018-03-29: qty 2

## 2018-03-29 MED ORDER — HEPARIN (PORCINE) IN NACL 1000-0.9 UT/500ML-% IV SOLN
INTRAVENOUS | Status: DC | PRN
  Administered 2018-03-29 (×2): 500 mL

## 2018-03-29 MED ORDER — ACETAMINOPHEN 325 MG PO TABS: 650.0000 mg | ORAL_TABLET | ORAL | Status: DC | PRN

## 2018-03-29 MED ORDER — SODIUM CHLORIDE 0.9% FLUSH: 3.0000 mL | Freq: Two times a day (BID) | INTRAVENOUS | Status: DC

## 2018-03-29 MED ORDER — IODIXANOL 320 MG/ML IV SOLN
INTRAVENOUS | Status: DC | PRN
  Administered 2018-03-29: 95 mL via INTRA_ARTERIAL

## 2018-03-29 MED ORDER — LIDOCAINE HCL (PF) 1 % IJ SOLN
INTRAMUSCULAR | Status: DC | PRN
  Administered 2018-03-29: 15 mL via INTRADERMAL

## 2018-03-29 MED ORDER — SODIUM CHLORIDE 0.9 % IV SOLN: 250.0000 mL | INTRAVENOUS | Status: DC | PRN

## 2018-03-29 MED ORDER — LIDOCAINE HCL (PF) 1 % IJ SOLN
INTRAMUSCULAR | Status: AC
  Filled 2018-03-29: qty 30

## 2018-03-29 MED ORDER — SODIUM CHLORIDE 0.9 % IV BOLUS
500.0000 mL | Freq: Once | INTRAVENOUS | Status: AC
  Administered 2018-03-29: 500 mL via INTRAVENOUS

## 2018-03-29 MED ORDER — ONDANSETRON HCL 4 MG/2ML IJ SOLN: 4.0000 mg | Freq: Four times a day (QID) | INTRAMUSCULAR | Status: DC | PRN

## 2018-03-29 MED ORDER — HYDRALAZINE HCL 20 MG/ML IJ SOLN: 5.0000 mg | INTRAMUSCULAR | Status: DC | PRN

## 2018-03-29 MED ORDER — HEPARIN (PORCINE) IN NACL 1000-0.9 UT/500ML-% IV SOLN
INTRAVENOUS | Status: AC
  Filled 2018-03-29: qty 1000

## 2018-03-29 MED ORDER — SODIUM CHLORIDE 0.9 % IV SOLN: INTRAVENOUS | Status: DC

## 2018-03-29 MED ORDER — MIDAZOLAM HCL 2 MG/2ML IJ SOLN
INTRAMUSCULAR | Status: DC | PRN
  Administered 2018-03-29: 1 mg via INTRAVENOUS

## 2018-03-29 SURGICAL SUPPLY — 14 items
CATH ANGIO 5F PIGTAIL 65CM (CATHETERS) ×2 IMPLANT
CATH STRAIGHT 5FR 65CM (CATHETERS) ×2 IMPLANT
CLOSURE MYNX CONTROL 5F (Vascular Products) ×2 IMPLANT
GUIDEWIRE ANGLED .035X150CM (WIRE) ×2 IMPLANT
KIT MICROPUNCTURE NIT STIFF (SHEATH) ×2 IMPLANT
KIT PV (KITS) ×2 IMPLANT
SHEATH PINNACLE 5F 10CM (SHEATH) ×2 IMPLANT
SHEATH PROBE COVER 6X72 (BAG) ×2 IMPLANT
STOPCOCK MORSE 400PSI 3WAY (MISCELLANEOUS) ×2 IMPLANT
SYRINGE MEDRAD AVANTA MACH 7 (SYRINGE) ×2 IMPLANT
TRANSDUCER W/STOPCOCK (MISCELLANEOUS) ×2 IMPLANT
TRAY PV CATH (CUSTOM PROCEDURE TRAY) ×2 IMPLANT
TUBING CIL FLEX 10 FLL-RA (TUBING) ×2 IMPLANT
WIRE HITORQ VERSACORE ST 145CM (WIRE) ×2 IMPLANT

## 2018-03-29 NOTE — Discharge Instructions (Signed)

## 2018-03-29 NOTE — Interval H&P Note (Signed)
History and Physical Interval Note:  03/29/2018 10:32 AM  Terry Allison  has presented today for surgery, with the diagnosis of pad  The various methods of treatment have been discussed with the patient and family. After consideration of risks, benefits and other options for treatment, the patient has consented to  Procedure(s): LOWER EXTREMITY ANGIOGRAPHY (N/A) and possible angioplasty as a surgical intervention .  The patient's history has been reviewed, patient examined, no change in status, stable for surgery.  I have reviewed the patient's chart and labs.  Questions were answered to the patient's satisfaction.     Yates Decamp

## 2018-03-29 NOTE — Interval H&P Note (Signed)
History and Physical Interval Note:  03/29/2018 10:52 AM  Terry Allison  has presented today for surgery, with the diagnosis of pad  The various methods of treatment have been discussed with the patient and family. After consideration of risks, benefits and other options for treatment, the patient has consented to  Procedure(s): LOWER EXTREMITY ANGIOGRAPHY (N/A) as a surgical intervention .  The patient's history has been reviewed, patient examined, no change in status, stable for surgery.  I have reviewed the patient's chart and labs.  Questions were answered to the patient's satisfaction.     Taneah Masri J Luby Seamans

## 2018-04-05 ENCOUNTER — Other Ambulatory Visit: Payer: Self-pay | Admitting: Physician Assistant

## 2018-04-05 DIAGNOSIS — E785 Hyperlipidemia, unspecified: Secondary | ICD-10-CM

## 2018-04-05 NOTE — Telephone Encounter (Signed)
Spoke with pt, needs appt. States she will call back to schedule.

## 2018-04-07 DIAGNOSIS — L97909 Non-pressure chronic ulcer of unspecified part of unspecified lower leg with unspecified severity: Secondary | ICD-10-CM | POA: Diagnosis not present

## 2018-04-07 DIAGNOSIS — I739 Peripheral vascular disease, unspecified: Secondary | ICD-10-CM | POA: Diagnosis not present

## 2018-04-07 DIAGNOSIS — E7800 Pure hypercholesterolemia, unspecified: Secondary | ICD-10-CM | POA: Diagnosis not present

## 2018-04-07 DIAGNOSIS — L97512 Non-pressure chronic ulcer of other part of right foot with fat layer exposed: Secondary | ICD-10-CM | POA: Diagnosis not present

## 2018-04-22 ENCOUNTER — Other Ambulatory Visit: Payer: Self-pay | Admitting: Physician Assistant

## 2018-04-22 DIAGNOSIS — E039 Hypothyroidism, unspecified: Secondary | ICD-10-CM

## 2018-04-22 NOTE — Telephone Encounter (Signed)
Call placed to patient who states she is no longer using [facility] for medical care. Pt advised that RX for levothyroxine will need to be ordered by her new provider.

## 2018-04-23 IMAGING — CR DG CHEST 2V
2 series · 2 of 2 positions shown · non-contrast
Comparison: None.

CLINICAL DATA: Shortness of breath and presyncope

EXAM:
CHEST  2 VIEW

[chest lat]
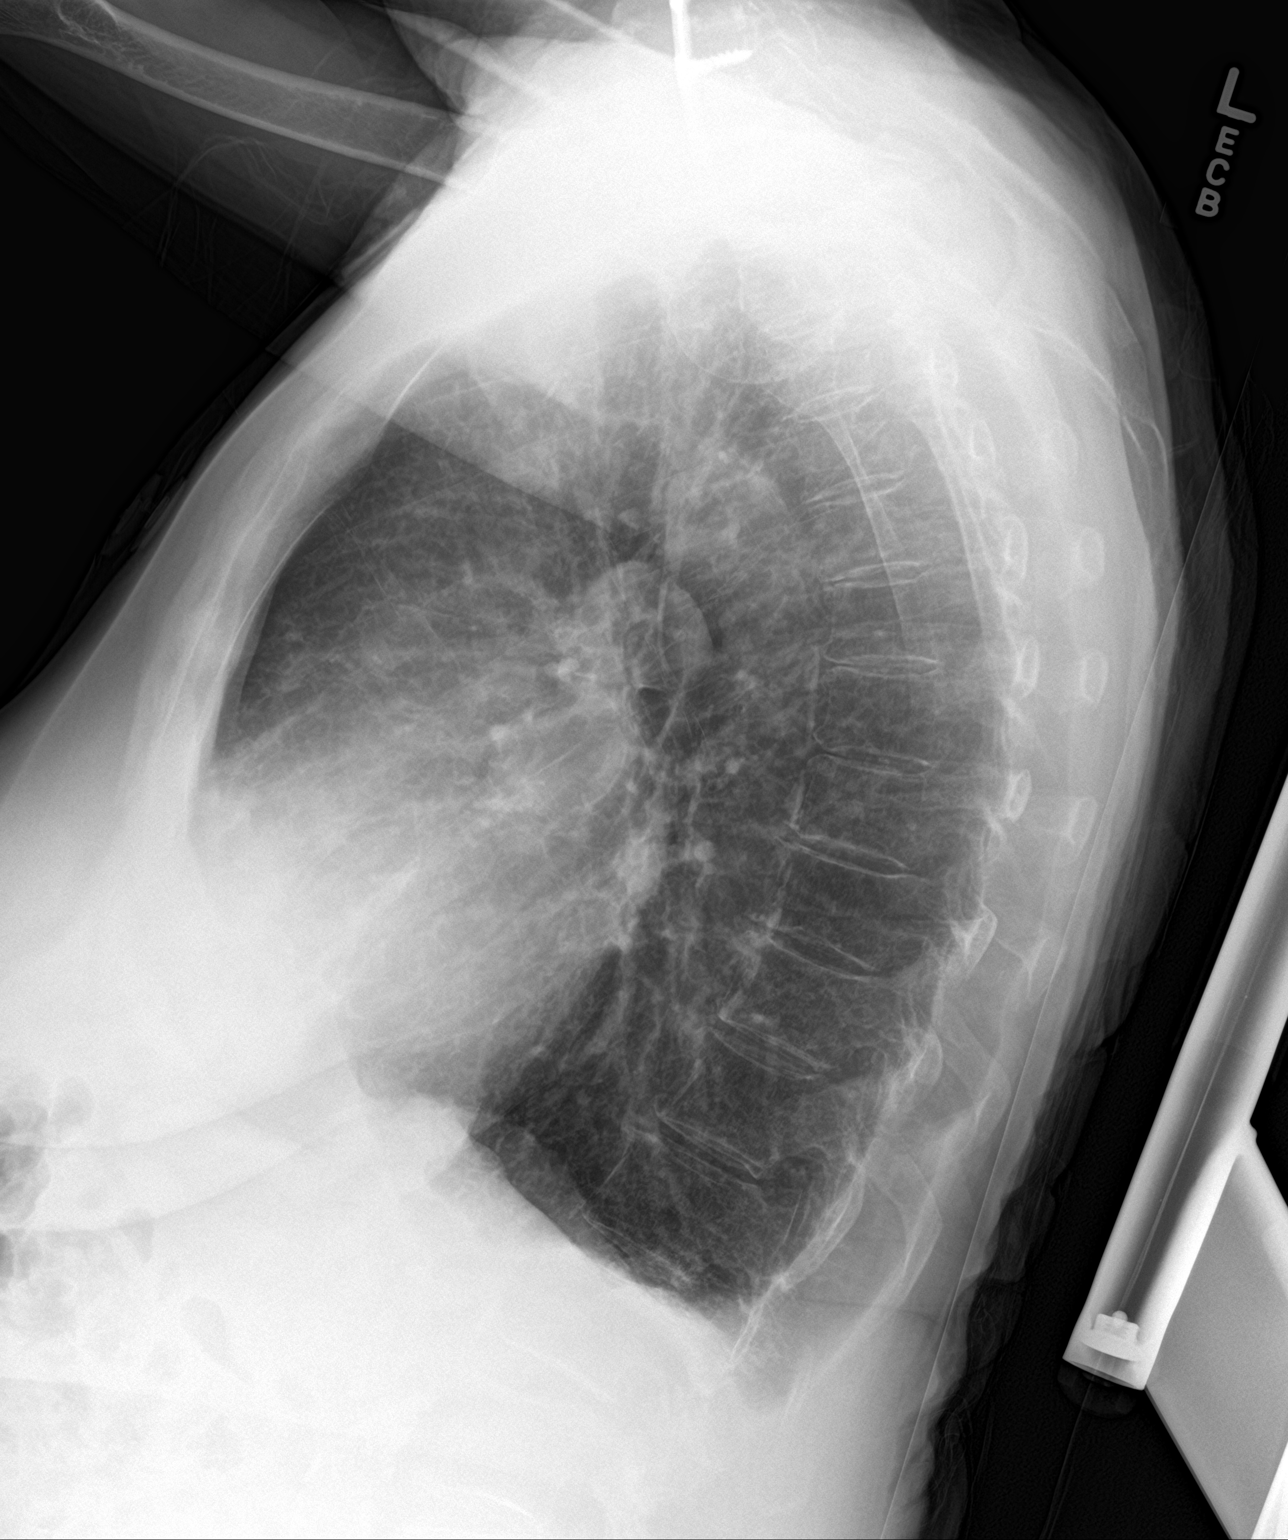

[chest ap]
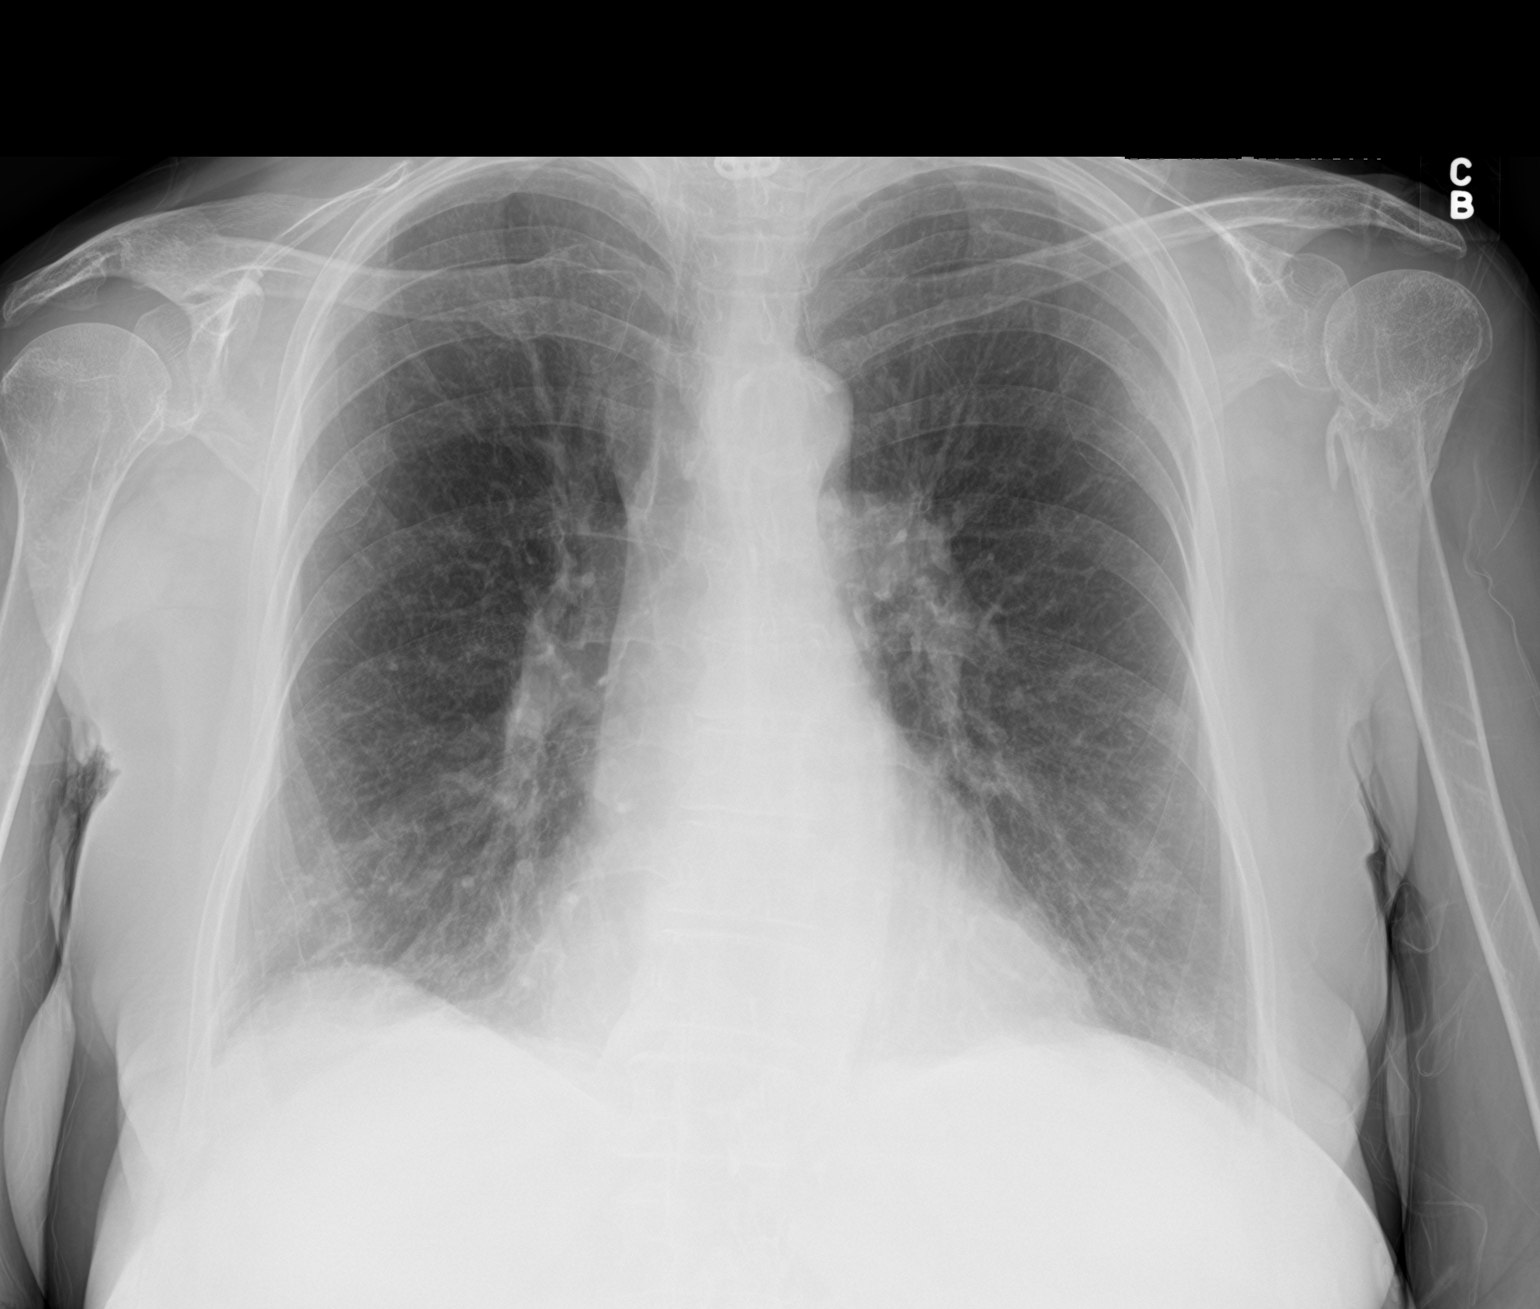

[2 of 2 positions shown; findings below may reference images not displayed]

FINDINGS: There is mild scarring in the right base. There is no edema or
consolidation. Heart size and pulmonary vascularity are normal.
There is aortic atherosclerosis. No adenopathy. There is an old
healed fracture of the proximal left humerus. There is postoperative
change in the lower cervical spine.
IMPRESSION: Scarring right base. No edema or consolidation. Heart size normal.
There is aortic atherosclerosis.

Aortic Atherosclerosis ([id number]).

## 2018-04-29 ENCOUNTER — Other Ambulatory Visit: Payer: Self-pay | Admitting: Physician Assistant

## 2018-04-29 DIAGNOSIS — I10 Essential (primary) hypertension: Secondary | ICD-10-CM

## 2018-04-29 NOTE — Telephone Encounter (Signed)
Left message for pt to return call to the office to schedule appt with another provider. LOV 04/15/17 with Kathrene AluBrittany Wiseman,PA.  30 day courtesy refill provided

## 2018-05-24 ENCOUNTER — Other Ambulatory Visit: Payer: Self-pay | Admitting: Physician Assistant

## 2018-05-24 DIAGNOSIS — I10 Essential (primary) hypertension: Secondary | ICD-10-CM

## 2018-05-24 NOTE — Telephone Encounter (Signed)
Refill request for metoprolol; last office visit 04/16/17 with Terry Allison; no upcoming visits noted; left message on voicemail for pt to call office; when pt calls back please schedule office visit.transfer of care appointment; will grant 30 courtesy refill per protocol.

## 2018-06-17 ENCOUNTER — Other Ambulatory Visit: Payer: Self-pay | Admitting: Physician Assistant

## 2018-06-17 DIAGNOSIS — I10 Essential (primary) hypertension: Secondary | ICD-10-CM

## 2018-07-16 ENCOUNTER — Other Ambulatory Visit: Payer: Self-pay | Admitting: Physician Assistant

## 2018-07-16 DIAGNOSIS — I10 Essential (primary) hypertension: Secondary | ICD-10-CM

## 2018-07-18 NOTE — Telephone Encounter (Signed)
Phone call to pt. to discuss scheduling an appt. with PCP, to continue to get medication refills.  Pt. stated she changed to different primary care physician, and to ignore the pharmacy request.  Encouraged to inform her pharmacy of new primary care physician, so that the refill requests can be routed to the proper physician.  Verbalized understanding.

## 2018-08-30 DIAGNOSIS — G629 Polyneuropathy, unspecified: Secondary | ICD-10-CM | POA: Diagnosis not present

## 2018-08-30 DIAGNOSIS — I10 Essential (primary) hypertension: Secondary | ICD-10-CM | POA: Diagnosis not present

## 2019-04-04 DIAGNOSIS — Z0000 Encounter for general adult medical examination without abnormal findings: Secondary | ICD-10-CM | POA: Diagnosis not present

## 2019-04-04 DIAGNOSIS — I10 Essential (primary) hypertension: Secondary | ICD-10-CM | POA: Diagnosis not present

## 2019-04-04 DIAGNOSIS — E785 Hyperlipidemia, unspecified: Secondary | ICD-10-CM | POA: Diagnosis not present

## 2019-04-04 DIAGNOSIS — I700 Atherosclerosis of aorta: Secondary | ICD-10-CM | POA: Diagnosis not present

## 2019-04-04 DIAGNOSIS — R7309 Other abnormal glucose: Secondary | ICD-10-CM | POA: Diagnosis not present

## 2019-04-04 DIAGNOSIS — D539 Nutritional anemia, unspecified: Secondary | ICD-10-CM | POA: Diagnosis not present

## 2019-04-04 DIAGNOSIS — Z0001 Encounter for general adult medical examination with abnormal findings: Secondary | ICD-10-CM | POA: Diagnosis not present

## 2019-04-04 DIAGNOSIS — E039 Hypothyroidism, unspecified: Secondary | ICD-10-CM | POA: Diagnosis not present

## 2019-04-17 DIAGNOSIS — E785 Hyperlipidemia, unspecified: Secondary | ICD-10-CM | POA: Diagnosis not present

## 2019-04-17 DIAGNOSIS — Z0000 Encounter for general adult medical examination without abnormal findings: Secondary | ICD-10-CM | POA: Diagnosis not present

## 2019-04-17 DIAGNOSIS — F1020 Alcohol dependence, uncomplicated: Secondary | ICD-10-CM | POA: Diagnosis not present

## 2019-04-17 DIAGNOSIS — Z0001 Encounter for general adult medical examination with abnormal findings: Secondary | ICD-10-CM | POA: Diagnosis not present

## 2019-05-02 DIAGNOSIS — E785 Hyperlipidemia, unspecified: Secondary | ICD-10-CM | POA: Diagnosis not present

## 2019-05-02 DIAGNOSIS — M25579 Pain in unspecified ankle and joints of unspecified foot: Secondary | ICD-10-CM | POA: Diagnosis not present

## 2019-05-02 DIAGNOSIS — Z0000 Encounter for general adult medical examination without abnormal findings: Secondary | ICD-10-CM | POA: Diagnosis not present

## 2019-05-02 DIAGNOSIS — Z0001 Encounter for general adult medical examination with abnormal findings: Secondary | ICD-10-CM | POA: Diagnosis not present

## 2019-05-03 ENCOUNTER — Other Ambulatory Visit: Payer: Self-pay | Admitting: Family Medicine

## 2019-05-03 DIAGNOSIS — Z1231 Encounter for screening mammogram for malignant neoplasm of breast: Secondary | ICD-10-CM

## 2019-06-22 ENCOUNTER — Ambulatory Visit: Payer: Medicare HMO | Attending: Internal Medicine

## 2019-06-22 DIAGNOSIS — Z23 Encounter for immunization: Secondary | ICD-10-CM | POA: Insufficient documentation

## 2019-06-22 NOTE — Progress Notes (Signed)
   Covid-19 Vaccination Clinic  Name:  Terry Allison    MRN: [id number] DOB: May 12, 1946  06/22/2019  Terry Allison was observed post Covid-19 immunization for 15 minutes without incidence. She was provided with Vaccine Information Sheet and instruction to access the V-Safe system.   Terry Allison was instructed to call 911 with any severe reactions post vaccine: Marland Kitchen Difficulty breathing  . Swelling of your face and throat  . A fast heartbeat  . A bad rash all over your body  . Dizziness and weakness    Immunizations Administered    Name Date Dose VIS Date Route   Pfizer COVID-19 Vaccine 06/22/2019  6:15 PM 0.3 mL 05/12/2019 Intramuscular   Manufacturer: [employer], [employer]   Lot: [id number]   NDC: [address]-6

## 2019-07-13 ENCOUNTER — Ambulatory Visit: Payer: Medicare HMO | Attending: Internal Medicine

## 2019-07-13 DIAGNOSIS — Z23 Encounter for immunization: Secondary | ICD-10-CM

## 2019-07-13 NOTE — Progress Notes (Signed)
   Covid-19 Vaccination Clinic  Name:  Terry Allison    MRN: [id number] DOB: 05-31-1946  07/13/2019  Terry Allison was observed post Covid-19 immunization for 30 minutes based on pre-vaccination screening without incidence. She was provided with Vaccine Information Sheet and instruction to access the V-Safe system.   Terry Allison was instructed to call 911 with any severe reactions post vaccine: Marland Kitchen Difficulty breathing  . Swelling of your face and throat  . A fast heartbeat  . A bad rash all over your body  . Dizziness and weakness    Immunizations Administered    Name Date Dose VIS Date Route   Pfizer COVID-19 Vaccine 07/13/2019 11:56 AM 0.3 mL 05/12/2019 Intramuscular   Manufacturer: [employer], [employer]   Lot: [id number]   NDC: [address]-3

## 2019-10-25 ENCOUNTER — Other Ambulatory Visit: Payer: Self-pay | Admitting: Family Medicine

## 2019-10-25 DIAGNOSIS — I129 Hypertensive chronic kidney disease with stage 1 through stage 4 chronic kidney disease, or unspecified chronic kidney disease: Secondary | ICD-10-CM

## 2019-11-17 ENCOUNTER — Other Ambulatory Visit: Payer: Self-pay

## 2019-11-17 ENCOUNTER — Ambulatory Visit (HOSPITAL_COMMUNITY)
Admission: RE | Admit: 2019-11-17 | Discharge: 2019-11-17 | Disposition: A | Discharge status: Home / Self Care | Payer: Medicare HMO | Source: Ambulatory Visit | Attending: Cardiovascular Disease | Admitting: Cardiovascular Disease

## 2019-11-17 DIAGNOSIS — I129 Hypertensive chronic kidney disease with stage 1 through stage 4 chronic kidney disease, or unspecified chronic kidney disease: Secondary | ICD-10-CM | POA: Diagnosis not present

## 2019-12-25 ENCOUNTER — Inpatient Hospital Stay (HOSPITAL_COMMUNITY)
Admission: EM | Admit: 2019-12-25 | Discharge: 2019-12-31 | DRG: 481 | Disposition: A | Discharge status: Home Health | Payer: Medicare HMO | Attending: Student in an Organized Health Care Education/Training Program | Admitting: Student in an Organized Health Care Education/Training Program

## 2019-12-25 ENCOUNTER — Emergency Department (HOSPITAL_COMMUNITY): Payer: Medicare HMO

## 2019-12-25 DIAGNOSIS — M109 Gout, unspecified: Secondary | ICD-10-CM | POA: Diagnosis present

## 2019-12-25 DIAGNOSIS — W010XXA Fall on same level from slipping, tripping and stumbling without subsequent striking against object, initial encounter: Secondary | ICD-10-CM | POA: Diagnosis present

## 2019-12-25 DIAGNOSIS — F419 Anxiety disorder, unspecified: Secondary | ICD-10-CM | POA: Diagnosis present

## 2019-12-25 DIAGNOSIS — Z83438 Family history of other disorder of lipoprotein metabolism and other lipidemia: Secondary | ICD-10-CM | POA: Diagnosis not present

## 2019-12-25 DIAGNOSIS — I361 Nonrheumatic tricuspid (valve) insufficiency: Secondary | ICD-10-CM | POA: Diagnosis not present

## 2019-12-25 DIAGNOSIS — G8929 Other chronic pain: Secondary | ICD-10-CM | POA: Diagnosis present

## 2019-12-25 DIAGNOSIS — N179 Acute kidney failure, unspecified: Secondary | ICD-10-CM | POA: Diagnosis not present

## 2019-12-25 DIAGNOSIS — F329 Major depressive disorder, single episode, unspecified: Secondary | ICD-10-CM | POA: Diagnosis present

## 2019-12-25 DIAGNOSIS — R7989 Other specified abnormal findings of blood chemistry: Secondary | ICD-10-CM | POA: Diagnosis not present

## 2019-12-25 DIAGNOSIS — D539 Nutritional anemia, unspecified: Secondary | ICD-10-CM | POA: Diagnosis present

## 2019-12-25 DIAGNOSIS — Y9301 Activity, walking, marching and hiking: Secondary | ICD-10-CM | POA: Diagnosis present

## 2019-12-25 DIAGNOSIS — Z87891 Personal history of nicotine dependence: Secondary | ICD-10-CM | POA: Diagnosis not present

## 2019-12-25 DIAGNOSIS — J449 Chronic obstructive pulmonary disease, unspecified: Secondary | ICD-10-CM | POA: Diagnosis present

## 2019-12-25 DIAGNOSIS — E039 Hypothyroidism, unspecified: Secondary | ICD-10-CM | POA: Diagnosis present

## 2019-12-25 DIAGNOSIS — Z79899 Other long term (current) drug therapy: Secondary | ICD-10-CM

## 2019-12-25 DIAGNOSIS — Z79890 Hormone replacement therapy (postmenopausal): Secondary | ICD-10-CM

## 2019-12-25 DIAGNOSIS — Z20822 Contact with and (suspected) exposure to covid-19: Secondary | ICD-10-CM | POA: Diagnosis present

## 2019-12-25 DIAGNOSIS — Z885 Allergy status to narcotic agent status: Secondary | ICD-10-CM

## 2019-12-25 DIAGNOSIS — I10 Essential (primary) hypertension: Secondary | ICD-10-CM | POA: Diagnosis present

## 2019-12-25 DIAGNOSIS — I739 Peripheral vascular disease, unspecified: Secondary | ICD-10-CM | POA: Diagnosis present

## 2019-12-25 DIAGNOSIS — W19XXXA Unspecified fall, initial encounter: Secondary | ICD-10-CM

## 2019-12-25 DIAGNOSIS — Z8673 Personal history of transient ischemic attack (TIA), and cerebral infarction without residual deficits: Secondary | ICD-10-CM | POA: Diagnosis not present

## 2019-12-25 DIAGNOSIS — Z8249 Family history of ischemic heart disease and other diseases of the circulatory system: Secondary | ICD-10-CM

## 2019-12-25 DIAGNOSIS — E860 Dehydration: Secondary | ICD-10-CM | POA: Diagnosis not present

## 2019-12-25 DIAGNOSIS — Y92018 Other place in single-family (private) house as the place of occurrence of the external cause: Secondary | ICD-10-CM

## 2019-12-25 DIAGNOSIS — I493 Ventricular premature depolarization: Secondary | ICD-10-CM | POA: Diagnosis not present

## 2019-12-25 DIAGNOSIS — T148XXA Other injury of unspecified body region, initial encounter: Secondary | ICD-10-CM

## 2019-12-25 DIAGNOSIS — M25561 Pain in right knee: Secondary | ICD-10-CM | POA: Diagnosis present

## 2019-12-25 DIAGNOSIS — S72142A Displaced intertrochanteric fracture of left femur, initial encounter for closed fracture: Secondary | ICD-10-CM | POA: Diagnosis present

## 2019-12-25 DIAGNOSIS — Z419 Encounter for procedure for purposes other than remedying health state, unspecified: Secondary | ICD-10-CM

## 2019-12-25 DIAGNOSIS — M25579 Pain in unspecified ankle and joints of unspecified foot: Secondary | ICD-10-CM

## 2019-12-25 HISTORY — DX: Pure hypercholesterolemia, unspecified: E78.00

## 2019-12-25 LAB — CBC WITH DIFFERENTIAL/PLATELET
Abs Immature Granulocytes: 0.06 10*3/uL (ref 0.00–0.07)
Basophils Absolute: 0 10*3/uL (ref 0.0–0.1)
Basophils Relative: 0 %
Eosinophils Absolute: 0.1 10*3/uL (ref 0.0–0.5)
Eosinophils Relative: 1 %
HCT: 37.5 % (ref 36.0–46.0)
Hemoglobin: 11.3 g/dL — ABNORMAL LOW (ref 12.0–15.0)
Immature Granulocytes: 1 %
Lymphocytes Relative: 14 %
Lymphs Abs: 1.7 10*3/uL (ref 0.7–4.0)
MCH: 32.8 pg (ref 26.0–34.0)
MCHC: 30.1 g/dL (ref 30.0–36.0)
MCV: 109 fL — ABNORMAL HIGH (ref 80.0–100.0)
Monocytes Absolute: 0.7 10*3/uL (ref 0.1–1.0)
Monocytes Relative: 5 %
Neutro Abs: 9.6 10*3/uL — ABNORMAL HIGH (ref 1.7–7.7)
Neutrophils Relative %: 79 %
Platelets: 377 10*3/uL (ref 150–400)
RBC: 3.44 MIL/uL — ABNORMAL LOW (ref 3.87–5.11)
RDW: 13.4 % (ref 11.5–15.5)
WBC: 12.1 10*3/uL — ABNORMAL HIGH (ref 4.0–10.5)
nRBC: 0 % (ref 0.0–0.2)

## 2019-12-25 LAB — SURGICAL PCR SCREEN
MRSA, PCR: NEGATIVE
Staphylococcus aureus: NEGATIVE

## 2019-12-25 LAB — SARS CORONAVIRUS 2 BY RT PCR (HOSPITAL ORDER, PERFORMED IN ~~LOC~~ HOSPITAL LAB): SARS Coronavirus 2: NEGATIVE

## 2019-12-25 LAB — BASIC METABOLIC PANEL
Anion gap: 13 (ref 5–15)
BUN: 19 mg/dL (ref 8–23)
CO2: 16 mmol/L — ABNORMAL LOW (ref 22–32)
Calcium: 9.5 mg/dL (ref 8.9–10.3)
Chloride: 104 mmol/L (ref 98–111)
Creatinine, Ser: 1.07 mg/dL — ABNORMAL HIGH (ref 0.44–1.00)
GFR calc Af Amer: >60 mL/min (ref >60)
GFR calc non Af Amer: 52 mL/min — ABNORMAL LOW (ref >60)
Glucose, Bld: 105 mg/dL — ABNORMAL HIGH (ref 70–99)
Potassium: 5.3 mmol/L — ABNORMAL HIGH (ref 3.5–5.1)
Sodium: 133 mmol/L — ABNORMAL LOW (ref 135–145)

## 2019-12-25 LAB — MAGNESIUM: Magnesium: 1.2 mg/dL — ABNORMAL LOW (ref 1.7–2.4)

## 2019-12-25 LAB — CK: Total CK: 89 U/L (ref 38–234)

## 2019-12-25 LAB — TSH: TSH: 4.239 u[IU]/mL (ref 0.350–4.500)

## 2019-12-25 LAB — POTASSIUM: Potassium: 4.1 mmol/L (ref 3.5–5.1)

## 2019-12-25 MED ORDER — ACETAMINOPHEN 650 MG RE SUPP: 650.0000 mg | Freq: Four times a day (QID) | RECTAL | Status: DC | PRN

## 2019-12-25 MED ORDER — METOPROLOL TARTRATE 25 MG PO TABS
25.0000 mg | ORAL_TABLET | Freq: Two times a day (BID) | ORAL | Status: DC
  Administered 2019-12-25 – 2019-12-31 (×11): 25 mg via ORAL
  Filled 2019-12-25 (×13): qty 1

## 2019-12-25 MED ORDER — THIAMINE HCL 100 MG PO TABS
100.0000 mg | ORAL_TABLET | Freq: Every day | ORAL | Status: DC
  Administered 2019-12-25 – 2019-12-31 (×6): 100 mg via ORAL
  Filled 2019-12-25 (×6): qty 1

## 2019-12-25 MED ORDER — SENNOSIDES-DOCUSATE SODIUM 8.6-50 MG PO TABS: 1.0000 | ORAL_TABLET | Freq: Every evening | ORAL | Status: DC | PRN

## 2019-12-25 MED ORDER — PANTOPRAZOLE SODIUM 40 MG PO TBEC
40.0000 mg | DELAYED_RELEASE_TABLET | Freq: Every day | ORAL | Status: DC
  Administered 2019-12-25 – 2019-12-31 (×6): 40 mg via ORAL
  Filled 2019-12-25 (×6): qty 1

## 2019-12-25 MED ORDER — LEVOTHYROXINE SODIUM 75 MCG PO TABS
75.0000 ug | ORAL_TABLET | Freq: Every day | ORAL | Status: DC
  Administered 2019-12-27 – 2019-12-31 (×5): 75 ug via ORAL
  Filled 2019-12-25 (×5): qty 1

## 2019-12-25 MED ORDER — VITAMIN B-1 100 MG PO TABS
100.0000 mg | ORAL_TABLET | Freq: Every day | ORAL | Status: DC
  Filled 2019-12-25: qty 1

## 2019-12-25 MED ORDER — HYDROCODONE-ACETAMINOPHEN 5-325 MG PO TABS
1.0000 | ORAL_TABLET | Freq: Four times a day (QID) | ORAL | Status: DC | PRN
  Administered 2019-12-25 – 2019-12-31 (×17): 1 via ORAL
  Filled 2019-12-25 (×17): qty 1

## 2019-12-25 MED ORDER — FENTANYL CITRATE (PF) 100 MCG/2ML IJ SOLN
100.0000 ug | Freq: Once | INTRAMUSCULAR | Status: AC
  Administered 2019-12-25: 100 ug via INTRAVENOUS
  Filled 2019-12-25: qty 2

## 2019-12-25 MED ORDER — MUPIROCIN 2 % EX OINT
1.0000 "application " | TOPICAL_OINTMENT | Freq: Two times a day (BID) | CUTANEOUS | Status: AC
  Administered 2019-12-26 – 2019-12-30 (×7): 1 via NASAL
  Filled 2019-12-25 (×2): qty 22

## 2019-12-25 MED ORDER — LORAZEPAM 0.5 MG PO TABS
0.5000 mg | ORAL_TABLET | Freq: Three times a day (TID) | ORAL | Status: DC | PRN
  Administered 2019-12-29 – 2019-12-31 (×5): 0.5 mg via ORAL
  Filled 2019-12-25 (×5): qty 1

## 2019-12-25 MED ORDER — HEPARIN SODIUM (PORCINE) 5000 UNIT/ML IJ SOLN
5000.0000 [IU] | Freq: Three times a day (TID) | INTRAMUSCULAR | Status: AC
  Administered 2019-12-25 (×2): 5000 [IU] via SUBCUTANEOUS
  Filled 2019-12-25 (×2): qty 1

## 2019-12-25 MED ORDER — SODIUM CHLORIDE 0.9 % IV BOLUS
500.0000 mL | Freq: Once | INTRAVENOUS | Status: AC
  Administered 2019-12-25: 500 mL via INTRAVENOUS

## 2019-12-25 MED ORDER — ACETAMINOPHEN 325 MG PO TABS
650.0000 mg | ORAL_TABLET | Freq: Four times a day (QID) | ORAL | Status: DC | PRN
  Administered 2019-12-30: 650 mg via ORAL
  Filled 2019-12-25: qty 2

## 2019-12-25 MED ORDER — HYDROMORPHONE HCL 1 MG/ML IJ SOLN
0.5000 mg | INTRAMUSCULAR | Status: DC | PRN
  Administered 2019-12-25 – 2019-12-26 (×4): 0.5 mg via INTRAVENOUS
  Filled 2019-12-25 (×4): qty 1

## 2019-12-25 MED ORDER — HYDROMORPHONE HCL 1 MG/ML IJ SOLN
1.0000 mg | Freq: Once | INTRAMUSCULAR | Status: AC
  Administered 2019-12-25: 1 mg via INTRAVENOUS
  Filled 2019-12-25: qty 1

## 2019-12-25 MED ORDER — ATORVASTATIN CALCIUM 40 MG PO TABS
40.0000 mg | ORAL_TABLET | Freq: Every day | ORAL | Status: DC
  Administered 2019-12-25 – 2019-12-31 (×6): 40 mg via ORAL
  Filled 2019-12-25 (×6): qty 1

## 2019-12-25 NOTE — Plan of Care (Signed)
  Problem: Health Behavior/Discharge Planning: Goal: Ability to manage health-related needs will improve Outcome: Progressing   Problem: Clinical Measurements: Goal: Ability to maintain clinical measurements within normal limits will improve Outcome: Progressing   Problem: Nutrition: Goal: Adequate nutrition will be maintained Outcome: Progressing   Problem: Coping: Goal: Level of anxiety will decrease Outcome: Progressing   Problem: Elimination: Goal: Will not experience complications related to bowel motility Outcome: Progressing   Problem: Pain Managment: Goal: General experience of comfort will improve Outcome: Progressing   Problem: Safety: Goal: Ability to remain free from injury will improve Outcome: Progressing

## 2019-12-25 NOTE — Consult Note (Signed)
Reason for Consult:Left hip fx Referring Physician: E Wentz  Terry Allison is an 74 y.o. female.  HPI: Autie lost her balance and fell last night. She thinks it may have been partly due to a right knee injury she suffered and is wearing a brace for. In any event, she had immediate left hip pain and could not get up. She lay on the floor hoping the pain would relent but when it didn't she was brought to the ED for evaluation. X-rays showed a hip fx and orthopedic surgery was consulted.  Past Medical History:  Diagnosis Date  . Allergy   . Asthma   . COPD (chronic obstructive pulmonary disease) (HCC)   . Depression   . Hypertension   . Neuromuscular disorder (HCC)   . Thyroid disease     Past Surgical History:  Procedure Laterality Date  . FRACTURE SURGERY    . LOWER EXTREMITY ANGIOGRAPHY N/A 03/29/2018   Procedure: LOWER EXTREMITY ANGIOGRAPHY;  Surgeon: Ganji, Jay, MD;  Location: [facility];  Service: Cardiovascular;  Laterality: N/A;  . ORIF TIBIA PLATEAU Right 03/05/2014   Procedure: OPEN REDUCTION INTERNAL FIXATION (ORIF) TIBIAL PLATEAU;  Surgeon: Timothy D Murphy, MD;  Location: [facility] OR;  Service: Orthopedics;  Laterality: Right;  . SPINE SURGERY    . TONSILECTOMY/ADENOIDECTOMY WITH MYRINGOTOMY    . TUBAL LIGATION      Family History  Problem Relation Age of Onset  . Heart disease Father   . Mental illness Father   . Cancer Sister   . Hyperlipidemia Sister     Social History:  reports that she quit smoking about 23 years ago. She has never used smokeless tobacco. She reports current alcohol use of about 4.0 standard drinks of alcohol per week. She reports that she does not use drugs.  Allergies:  Allergies  Allergen Reactions  . Tramadol Nausea And Vomiting    Falling down     Medications: I have reviewed the patient's current medications.  Results for orders placed or performed during the hospital encounter of 12/25/19 (from the past 48 hour(s))  CBC  with Differential     Status: Abnormal   Collection Time: 12/25/19  9:56 AM  Result Value Ref Range   WBC 12.1 (H) 4.0 - 10.5 K/uL   RBC 3.44 (L) 3.87 - 5.11 MIL/uL   Hemoglobin 11.3 (L) 12.0 - 15.0 g/dL   HCT 37.5 36 - 46 %   MCV 109.0 (H) 80.0 - 100.0 fL   MCH 32.8 26.0 - 34.0 pg   MCHC 30.1 30.0 - 36.0 g/dL   RDW 13.4 11.5 - 15.5 %   Platelets 377 150 - 400 K/uL   nRBC 0.0 0.0 - 0.2 %   Neutrophils Relative % 79 %   Neutro Abs 9.6 (H) 1.7 - 7.7 K/uL   Lymphocytes Relative 14 %   Lymphs Abs 1.7 0.7 - 4.0 K/uL   Monocytes Relative 5 %   Monocytes Absolute 0.7 0 - 1 K/uL   Eosinophils Relative 1 %   Eosinophils Absolute 0.1 0 - 0 K/uL   Basophils Relative 0 %   Basophils Absolute 0.0 0 - 0 K/uL   Immature Granulocytes 1 %   Abs Immature Granulocytes 0.06 0.00 - 0.07 K/uL    Comment: Performed at [facility], [address] [place], [place] [address]  Basic metabolic panel     Status: Abnormal   Collection Time: 12/25/19  9:56 AM  Result Value Ref   Range   Sodium 133 (L) 135 - 145 mmol/L   Potassium 5.3 (H) 3.5 - 5.1 mmol/L   Chloride 104 98 - 111 mmol/L   CO2 16 (L) 22 - 32 mmol/L   Glucose, Bld 105 (H) 70 - 99 mg/dL    Comment: Glucose reference range applies only to samples taken after fasting for at least 8 hours.   BUN 19 8 - 23 mg/dL   Creatinine, Ser 2.44 (H) 0.44 - 1.00 mg/dL   Calcium 9.5 8.9 - 01.0 mg/dL   GFR calc non Af Amer 52 (L) >60 mL/min   GFR calc Af Amer >60 >60 mL/min   Anion gap 13 5 - 15    Comment: Performed at [facility], [address] [place], [place] [address]  [place]     Status: None   Collection Time: 12/25/19  9:56 AM  Result Value Ref Range   Total CK 89 38.0 - 234.0 U/L    Comment: Performed at [facility], [address], [place], [place] [address]    DG Chest 1 View  Result Date: 12/25/2019 CLINICAL DATA:  Left hip fracture EXAM: CHEST  1 VIEW COMPARISON:  05/27/2017 FINDINGS: Mild hyperinflation. Heart and  mediastinal contours are within normal limits. No focal opacities or effusions. No acute bony abnormality. IMPRESSION: Hyperinflation.  No active cardiopulmonary disease. Electronically Signed   By: Charlett Nose M.D.   On: 12/25/2019 10:30   DG Hip Unilat W or Wo Pelvis 2-3 Views Left  Result Date: 12/25/2019 CLINICAL DATA:  Fall, left hip pain EXAM: DG HIP (WITH OR WITHOUT PELVIS) 2-3V LEFT COMPARISON:  None. FINDINGS: There is a left femoral intertrochanteric fracture with varus angulation and displacement of fracture fragments. No subluxation or dislocation. No additional acute bony abnormality. IMPRESSION: Left femoral intertrochanteric fracture with varus angulation and displacement. Electronically Signed   By: Charlett Nose M.D.   On: 12/25/2019 10:30    Review of Systems  HENT: Negative for ear discharge, ear pain, hearing loss and tinnitus.   Eyes: Negative for photophobia and pain.  Respiratory: Negative for cough and shortness of breath.   Cardiovascular: Negative for chest pain.  Gastrointestinal: Negative for abdominal pain, nausea and vomiting.  Genitourinary: Negative for dysuria, flank pain, frequency and urgency.  Musculoskeletal: Positive for arthralgias (Left hip, right knee). Negative for back pain, myalgias and neck pain.  Neurological: Negative for dizziness and headaches.  Hematological: Does not bruise/bleed easily.  Psychiatric/Behavioral: The patient is not nervous/anxious.    Blood pressure (!) 141/93, pulse 82, temperature 98.9 F (37.2 C), temperature source Oral, resp. rate 17, height 5' (1.524 m), weight 47.6 kg, SpO2 100 %. Physical Exam Constitutional:      General: She is not in acute distress.    Appearance: She is well-developed. She is not diaphoretic.  HENT:     Head: Normocephalic and atraumatic.  Eyes:     General: No scleral icterus.       Right eye: No discharge.        Left eye: No discharge.     Conjunctiva/sclera: Conjunctivae normal.   Cardiovascular:     Rate and Rhythm: Normal rate and regular rhythm.  Pulmonary:     Effort: Pulmonary effort is normal. No respiratory distress.  Musculoskeletal:     Cervical back: Normal range of motion.     Comments: LLE No traumatic wounds, ecchymosis, or rash  Mod TTP hip  No knee or ankle effusion  Knee stable to varus/ valgus and anterior/posterior stress  Sens DPN, SPN, TN intact  Motor EHL, ext, flex, evers 5/5  DP 1+, PT 0, No significant edema  Skin:    General: Skin is warm and dry.  Neurological:     Mental Status: She is alert.  Psychiatric:        Behavior: Behavior normal.     Assessment/Plan: Left hip fx -- Plan IMN tomorrow with Dr. Eulah Pont. Please keep NPO after MN. Multiple medical problems including asthma, COPD, HTN, and thyroid disease -- IM to admit and manage. Appreciate their help.    Freeman Caldron, PA-C Orthopedic Surgery [phone] 12/25/2019, 12:06 PM

## 2019-12-25 NOTE — ED Triage Notes (Signed)
Pt bib [facility]. Pt fell last night and was on the floor 8-9 hours. She was hopeful the pain would improve and chose not to call ems until this morning. C/o left hip pain, with shortening and rotation. Pt has brace on right knee prior to this incident. Pt given of Fentanyl. V/S 119/49 hr 86 100% RA
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
## 2019-12-25 NOTE — H&P (View-Only) (Signed)
Reason for Consult:Left hip fx Referring Physician: [name] is an 74 y.o. female.  HPI: Terry Allison lost her balance and fell last night. She thinks it may have been partly due to a right knee injury she suffered and is wearing a brace for. In any event, she had immediate left hip pain and could not get up. She lay on the floor hoping the pain would relent but when it didn't she was brought to the ED for evaluation. X-rays showed a hip fx and orthopedic surgery was consulted.  Past Medical History:  Diagnosis Date  . Allergy   . Asthma   . COPD (chronic obstructive pulmonary disease) (HCC)   . Depression   . Hypertension   . Neuromuscular disorder (HCC)   . Thyroid disease     Past Surgical History:  Procedure Laterality Date  . FRACTURE SURGERY    . LOWER EXTREMITY ANGIOGRAPHY N/A 03/29/2018   Procedure: LOWER EXTREMITY ANGIOGRAPHY;  Surgeon: Yates Decamp, MD;  Location: [facility]ASIVE [facility];  Service: Cardiovascular;  Laterality: N/A;  . ORIF TIBIA PLATEAU Right 03/05/2014   Procedure: OPEN REDUCTION INTERNAL FIXATION (ORIF) TIBIAL PLATEAU;  Surgeon: Sheral Apley, MD;  Location: [facility] OR;  Service: Orthopedics;  Laterality: Right;  . SPINE SURGERY    . TONSILECTOMY/ADENOIDECTOMY WITH MYRINGOTOMY    . TUBAL LIGATION      Family History  Problem Relation Age of Onset  . Heart disease Father   . Mental illness Father   . Cancer Sister   . Hyperlipidemia Sister     Social History:  reports that she quit smoking about 23 years ago. She has never used smokeless tobacco. She reports current alcohol use of about 4.0 standard drinks of alcohol per week. She reports that she does not use drugs.  Allergies:  Allergies  Allergen Reactions  . Tramadol Nausea And Vomiting    Falling down     Medications: I have reviewed the patient's current medications.  Results for orders placed or performed during the hospital encounter of 12/25/19 (from the past 48 hour(s))  CBC  with Differential     Status: Abnormal   Collection Time: 12/25/19  9:56 AM  Result Value Ref Range   WBC 12.1 (H) 4.0 - 10.5 K/uL   RBC 3.44 (L) 3.87 - 5.11 MIL/uL   Hemoglobin 11.3 (L) 12.0 - 15.0 g/dL   HCT 77.9 36 - 46 %   MCV 109.0 (H) 80.0 - 100.0 fL   MCH 32.8 26.0 - 34.0 pg   MCHC 30.1 30.0 - 36.0 g/dL   RDW 39.0 30.0 - 92.3 %   Platelets 377 150 - 400 K/uL   nRBC 0.0 0.0 - 0.2 %   Neutrophils Relative % 79 %   Neutro Abs 9.6 (H) 1.7 - 7.7 K/uL   Lymphocytes Relative 14 %   Lymphs Abs 1.7 0.7 - 4.0 K/uL   Monocytes Relative 5 %   Monocytes Absolute 0.7 0 - 1 K/uL   Eosinophils Relative 1 %   Eosinophils Absolute 0.1 0 - 0 K/uL   Basophils Relative 0 %   Basophils Absolute 0.0 0 - 0 K/uL   Immature Granulocytes 1 %   Abs Immature Granulocytes 0.06 0.00 - 0.07 K/uL    Comment: Performed at [facility], [address] [place], [place] [address]  Basic metabolic panel     Status: Abnormal   Collection Time: 12/25/19  9:56 AM  Result Value Ref  Range   Sodium 133 (L) 135 - 145 mmol/L   Potassium 5.3 (H) 3.5 - 5.1 mmol/L   Chloride 104 98 - 111 mmol/L   CO2 16 (L) 22 - 32 mmol/L   Glucose, Bld 105 (H) 70 - 99 mg/dL    Comment: Glucose reference range applies only to samples taken after fasting for at least 8 hours.   BUN 19 8 - 23 mg/dL   Creatinine, Ser 2.44 (H) 0.44 - 1.00 mg/dL   Calcium 9.5 8.9 - 01.0 mg/dL   GFR calc non Af Amer 52 (L) >60 mL/min   GFR calc Af Amer >60 >60 mL/min   Anion gap 13 5 - 15    Comment: Performed at [facility], [address] [place], [place] [address]  CK     Status: None   Collection Time: 12/25/19  9:56 AM  Result Value Ref Range   Total CK 89 38.0 - 234.0 U/L    Comment: Performed at [facility], [address], [place], [place] [address]    DG Chest 1 View  Result Date: 12/25/2019 CLINICAL DATA:  Left hip fracture EXAM: CHEST  1 VIEW COMPARISON:  05/27/2017 FINDINGS: Mild hyperinflation. Heart and  mediastinal contours are within normal limits. No focal opacities or effusions. No acute bony abnormality. IMPRESSION: Hyperinflation.  No active cardiopulmonary disease. Electronically Signed   By: Charlett Nose M.D.   On: 12/25/2019 10:30   DG Hip Unilat W or Wo Pelvis 2-3 Views Left  Result Date: 12/25/2019 CLINICAL DATA:  Fall, left hip pain EXAM: DG HIP (WITH OR WITHOUT PELVIS) 2-3V LEFT COMPARISON:  None. FINDINGS: There is a left femoral intertrochanteric fracture with varus angulation and displacement of fracture fragments. No subluxation or dislocation. No additional acute bony abnormality. IMPRESSION: Left femoral intertrochanteric fracture with varus angulation and displacement. Electronically Signed   By: Charlett Nose M.D.   On: 12/25/2019 10:30    Review of Systems  HENT: Negative for ear discharge, ear pain, hearing loss and tinnitus.   Eyes: Negative for photophobia and pain.  Respiratory: Negative for cough and shortness of breath.   Cardiovascular: Negative for chest pain.  Gastrointestinal: Negative for abdominal pain, nausea and vomiting.  Genitourinary: Negative for dysuria, flank pain, frequency and urgency.  Musculoskeletal: Positive for arthralgias (Left hip, right knee). Negative for back pain, myalgias and neck pain.  Neurological: Negative for dizziness and headaches.  Hematological: Does not bruise/bleed easily.  Psychiatric/Behavioral: The patient is not nervous/anxious.    Blood pressure (!) 141/93, pulse 82, temperature 98.9 F (37.2 C), temperature source Oral, resp. rate 17, height 5' (1.524 m), weight 47.6 kg, SpO2 100 %. Physical Exam Constitutional:      General: She is not in acute distress.    Appearance: She is well-developed. She is not diaphoretic.  HENT:     Head: Normocephalic and atraumatic.  Eyes:     General: No scleral icterus.       Right eye: No discharge.        Left eye: No discharge.     Conjunctiva/sclera: Conjunctivae normal.   Cardiovascular:     Rate and Rhythm: Normal rate and regular rhythm.  Pulmonary:     Effort: Pulmonary effort is normal. No respiratory distress.  Musculoskeletal:     Cervical back: Normal range of motion.     Comments: LLE No traumatic wounds, ecchymosis, or rash  Mod TTP hip  No knee or ankle effusion  Knee stable to varus/ valgus and anterior/posterior stress  Sens DPN, SPN, TN intact  Motor EHL, ext, flex, evers 5/5  DP 1+, PT 0, No significant edema  Skin:    General: Skin is warm and dry.  Neurological:     Mental Status: She is alert.  Psychiatric:        Behavior: Behavior normal.     Assessment/Plan: Left hip fx -- Plan IMN tomorrow with Dr. Eulah Pont. Please keep NPO after MN. Multiple medical problems including asthma, COPD, HTN, and thyroid disease -- IM to admit and manage. Appreciate their help.    Freeman Caldron, PA-C Orthopedic Surgery [phone] 12/25/2019, 12:06 PM

## 2019-12-25 NOTE — ED Notes (Signed)
Lunch Tray Ordered @ 1431.
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
## 2019-12-25 NOTE — ED Notes (Signed)
Lab called to add on Magnesium  

## 2019-12-25 NOTE — ED Notes (Signed)
Attempted report x1. 

## 2019-12-25 NOTE — ED Provider Notes (Signed)
  Face-to-face evaluation   History: She presents for evaluation of injury last night plan she was walking stable, possibly related to her right knee injury.  She injured her left hip when she fell.  She does not really get up after the fall.  Physical exam: Alert elderly female.  She is lucid.  She is uncomfortable because of left hip pain.  She denies head injury or neck pain.   Medical screening examination/treatment/procedure(s) were conducted as a shared visit with non-physician practitioner(s) and myself.  I personally evaluated the patient during the encounter    Mancel Bale, MD 12/25/19 1740

## 2019-12-26 ENCOUNTER — Other Ambulatory Visit: Payer: Self-pay

## 2019-12-26 ENCOUNTER — Encounter (HOSPITAL_COMMUNITY)
Admission: EM | Disposition: A | Discharge status: Home Health | Payer: Self-pay | Source: Home / Self Care | Attending: Internal Medicine

## 2019-12-26 ENCOUNTER — Inpatient Hospital Stay (HOSPITAL_COMMUNITY): Payer: Medicare HMO | Admitting: Certified Registered Nurse Anesthetist

## 2019-12-26 ENCOUNTER — Inpatient Hospital Stay (HOSPITAL_COMMUNITY): Payer: Medicare HMO

## 2019-12-26 ENCOUNTER — Encounter (HOSPITAL_COMMUNITY): Payer: Self-pay | Admitting: Internal Medicine

## 2019-12-26 ENCOUNTER — Ambulatory Visit: Payer: Self-pay | Admitting: Cardiology

## 2019-12-26 DIAGNOSIS — I361 Nonrheumatic tricuspid (valve) insufficiency: Secondary | ICD-10-CM

## 2019-12-26 DIAGNOSIS — W19XXXA Unspecified fall, initial encounter: Secondary | ICD-10-CM | POA: Diagnosis present

## 2019-12-26 HISTORY — PX: INTRAMEDULLARY (IM) NAIL INTERTROCHANTERIC: SHX5875

## 2019-12-26 LAB — CBC WITH DIFFERENTIAL/PLATELET
Abs Immature Granulocytes: 0.03 10*3/uL (ref 0.00–0.07)
Basophils Absolute: 0 10*3/uL (ref 0.0–0.1)
Basophils Relative: 1 %
Eosinophils Absolute: 0.1 10*3/uL (ref 0.0–0.5)
Eosinophils Relative: 1 %
HCT: 32 % — ABNORMAL LOW (ref 36.0–46.0)
Hemoglobin: 10.2 g/dL — ABNORMAL LOW (ref 12.0–15.0)
Immature Granulocytes: 0 %
Lymphocytes Relative: 23 %
Lymphs Abs: 2 10*3/uL (ref 0.7–4.0)
MCH: 33.6 pg (ref 26.0–34.0)
MCHC: 31.9 g/dL (ref 30.0–36.0)
MCV: 105.3 fL — ABNORMAL HIGH (ref 80.0–100.0)
Monocytes Absolute: 0.7 10*3/uL (ref 0.1–1.0)
Monocytes Relative: 9 %
Neutro Abs: 5.7 10*3/uL (ref 1.7–7.7)
Neutrophils Relative %: 66 %
Platelets: 318 10*3/uL (ref 150–400)
RBC: 3.04 MIL/uL — ABNORMAL LOW (ref 3.87–5.11)
RDW: 13.2 % (ref 11.5–15.5)
WBC: 8.5 10*3/uL (ref 4.0–10.5)
nRBC: 0 % (ref 0.0–0.2)

## 2019-12-26 LAB — ECHOCARDIOGRAM COMPLETE
Area-P 1/2: 2.73 cm2
Calc EF: 75.9 %
Height: 60 in
S' Lateral: 1.9 cm
Single Plane A2C EF: 77.8 %
Single Plane A4C EF: 74.8 %
Weight: 1756.63 oz

## 2019-12-26 LAB — BASIC METABOLIC PANEL
Anion gap: 11 (ref 5–15)
BUN: 16 mg/dL (ref 8–23)
CO2: 20 mmol/L — ABNORMAL LOW (ref 22–32)
Calcium: 9.4 mg/dL (ref 8.9–10.3)
Chloride: 104 mmol/L (ref 98–111)
Creatinine, Ser: 1.06 mg/dL — ABNORMAL HIGH (ref 0.44–1.00)
GFR calc Af Amer: >60 mL/min (ref >60)
GFR calc non Af Amer: 52 mL/min — ABNORMAL LOW (ref >60)
Glucose, Bld: 110 mg/dL — ABNORMAL HIGH (ref 70–99)
Potassium: 4.5 mmol/L (ref 3.5–5.1)
Sodium: 135 mmol/L (ref 135–145)

## 2019-12-26 LAB — VITAMIN B12: Vitamin B-12: 296 pg/mL (ref 180–914)

## 2019-12-26 LAB — FOLATE: Folate: 17.5 ng/mL (ref >5.9)

## 2019-12-26 SURGERY — FIXATION, FRACTURE, INTERTROCHANTERIC, WITH INTRAMEDULLARY ROD
Anesthesia: General | Site: Leg Upper | Laterality: Left

## 2019-12-26 MED ORDER — SUGAMMADEX SODIUM 200 MG/2ML IV SOLN
INTRAVENOUS | Status: DC | PRN
  Administered 2019-12-26: 200 mg via INTRAVENOUS

## 2019-12-26 MED ORDER — MIDAZOLAM HCL 2 MG/2ML IJ SOLN
INTRAMUSCULAR | Status: AC
  Filled 2019-12-26: qty 2

## 2019-12-26 MED ORDER — HYDROMORPHONE HCL 1 MG/ML IJ SOLN: 0.2500 mg | INTRAMUSCULAR | Status: DC | PRN

## 2019-12-26 MED ORDER — FENTANYL CITRATE (PF) 250 MCG/5ML IJ SOLN
INTRAMUSCULAR | Status: DC | PRN
  Administered 2019-12-26 (×2): 50 ug via INTRAVENOUS

## 2019-12-26 MED ORDER — CHLORHEXIDINE GLUCONATE 4 % EX LIQD: 60.0000 mL | Freq: Once | CUTANEOUS | Status: DC

## 2019-12-26 MED ORDER — FENTANYL CITRATE (PF) 250 MCG/5ML IJ SOLN
INTRAMUSCULAR | Status: AC
  Filled 2019-12-26: qty 5

## 2019-12-26 MED ORDER — PHENYLEPHRINE 40 MCG/ML (10ML) SYRINGE FOR IV PUSH (FOR BLOOD PRESSURE SUPPORT)
PREFILLED_SYRINGE | INTRAVENOUS | Status: DC | PRN
  Administered 2019-12-26 (×2): 160 ug via INTRAVENOUS
  Administered 2019-12-26: 80 ug via INTRAVENOUS

## 2019-12-26 MED ORDER — CHLORHEXIDINE GLUCONATE 0.12 % MT SOLN
15.0000 mL | Freq: Once | OROMUCOSAL | Status: AC
  Administered 2019-12-26: 15 mL via OROMUCOSAL
  Filled 2019-12-26: qty 15

## 2019-12-26 MED ORDER — MAGNESIUM SULFATE 2 GM/50ML IV SOLN
2.0000 g | Freq: Once | INTRAVENOUS | Status: AC
  Administered 2019-12-26: 2 g via INTRAVENOUS
  Filled 2019-12-26: qty 50

## 2019-12-26 MED ORDER — ALBUMIN HUMAN 5 % IV SOLN
INTRAVENOUS | Status: DC | PRN
Start: 2019-12-26 — End: 2019-12-26

## 2019-12-26 MED ORDER — ROCURONIUM BROMIDE 10 MG/ML (PF) SYRINGE
PREFILLED_SYRINGE | INTRAVENOUS | Status: AC
  Filled 2019-12-26: qty 10

## 2019-12-26 MED ORDER — PROPOFOL 10 MG/ML IV BOLUS
INTRAVENOUS | Status: DC | PRN
  Administered 2019-12-26: 90 mg via INTRAVENOUS

## 2019-12-26 MED ORDER — BENAZEPRIL HCL 20 MG PO TABS
10.0000 mg | ORAL_TABLET | Freq: Two times a day (BID) | ORAL | Status: DC
  Administered 2019-12-26 – 2019-12-27 (×3): 10 mg via ORAL
  Filled 2019-12-26 (×3): qty 1

## 2019-12-26 MED ORDER — ONDANSETRON HCL 4 MG/2ML IJ SOLN
INTRAMUSCULAR | Status: DC | PRN
  Administered 2019-12-26: 4 mg via INTRAVENOUS

## 2019-12-26 MED ORDER — LACTATED RINGERS IV SOLN: INTRAVENOUS | Status: DC | PRN

## 2019-12-26 MED ORDER — CEFAZOLIN SODIUM-DEXTROSE 2-4 GM/100ML-% IV SOLN
2.0000 g | INTRAVENOUS | Status: AC
  Administered 2019-12-26: 2 g via INTRAVENOUS
  Filled 2019-12-26: qty 100

## 2019-12-26 MED ORDER — HEPARIN SODIUM (PORCINE) 5000 UNIT/ML IJ SOLN: 5000.0000 [IU] | Freq: Three times a day (TID) | INTRAMUSCULAR | Status: DC

## 2019-12-26 MED ORDER — STERILE WATER FOR IRRIGATION IR SOLN
Status: DC | PRN
  Administered 2019-12-26: 1000 mL

## 2019-12-26 MED ORDER — PREGABALIN 75 MG PO CAPS
75.0000 mg | ORAL_CAPSULE | Freq: Two times a day (BID) | ORAL | Status: DC
  Administered 2019-12-26 – 2019-12-31 (×10): 75 mg via ORAL
  Filled 2019-12-26 (×10): qty 1

## 2019-12-26 MED ORDER — PROPOFOL 10 MG/ML IV BOLUS
INTRAVENOUS | Status: AC
  Filled 2019-12-26: qty 20

## 2019-12-26 MED ORDER — DEXAMETHASONE SODIUM PHOSPHATE 10 MG/ML IJ SOLN
INTRAMUSCULAR | Status: DC | PRN
  Administered 2019-12-26: 10 mg via INTRAVENOUS

## 2019-12-26 MED ORDER — POVIDONE-IODINE 10 % EX SWAB: 2.0000 "application " | Freq: Once | CUTANEOUS | Status: DC

## 2019-12-26 MED ORDER — ONDANSETRON HCL 4 MG/2ML IJ SOLN
INTRAMUSCULAR | Status: AC
  Filled 2019-12-26: qty 2

## 2019-12-26 MED ORDER — ENSURE PRE-SURGERY PO LIQD
296.0000 mL | Freq: Once | ORAL | Status: DC
  Filled 2019-12-26: qty 296

## 2019-12-26 MED ORDER — LACTATED RINGERS IV SOLN: INTRAVENOUS | Status: DC

## 2019-12-26 MED ORDER — AMLODIPINE BESYLATE 5 MG PO TABS
5.0000 mg | ORAL_TABLET | Freq: Every day | ORAL | Status: DC
  Administered 2019-12-26 – 2019-12-31 (×6): 5 mg via ORAL
  Filled 2019-12-26 (×6): qty 1

## 2019-12-26 MED ORDER — PHENYLEPHRINE 40 MCG/ML (10ML) SYRINGE FOR IV PUSH (FOR BLOOD PRESSURE SUPPORT)
PREFILLED_SYRINGE | INTRAVENOUS | Status: AC
  Filled 2019-12-26: qty 10

## 2019-12-26 MED ORDER — DEXAMETHASONE SODIUM PHOSPHATE 10 MG/ML IJ SOLN
INTRAMUSCULAR | Status: AC
  Filled 2019-12-26: qty 1

## 2019-12-26 MED ORDER — DEXMEDETOMIDINE (PRECEDEX) IN NS 20 MCG/5ML (4 MCG/ML) IV SYRINGE
PREFILLED_SYRINGE | INTRAVENOUS | Status: DC | PRN
  Administered 2019-12-26 (×2): 8 ug via INTRAVENOUS

## 2019-12-26 MED ORDER — ROCURONIUM BROMIDE 10 MG/ML (PF) SYRINGE
PREFILLED_SYRINGE | INTRAVENOUS | Status: DC | PRN
  Administered 2019-12-26: 50 mg via INTRAVENOUS

## 2019-12-26 MED ORDER — ASPIRIN 81 MG PO CHEW
81.0000 mg | CHEWABLE_TABLET | Freq: Two times a day (BID) | ORAL | Status: DC
  Administered 2019-12-26 – 2019-12-31 (×10): 81 mg via ORAL
  Filled 2019-12-26 (×10): qty 1

## 2019-12-26 MED ORDER — LIDOCAINE 2% (20 MG/ML) 5 ML SYRINGE
INTRAMUSCULAR | Status: DC | PRN
  Administered 2019-12-26: 80 mg via INTRAVENOUS

## 2019-12-26 MED ORDER — MIDAZOLAM HCL 2 MG/2ML IJ SOLN
INTRAMUSCULAR | Status: DC | PRN
  Administered 2019-12-26: 2 mg via INTRAVENOUS

## 2019-12-26 MED ORDER — 0.9 % SODIUM CHLORIDE (POUR BTL) OPTIME
TOPICAL | Status: DC | PRN
  Administered 2019-12-26: 1000 mL

## 2019-12-26 SURGICAL SUPPLY — 37 items
CLOSURE STERI-STRIP 1/2X4 (GAUZE/BANDAGES/DRESSINGS) ×1
CLOSURE WOUND 1/2 X4 (GAUZE/BANDAGES/DRESSINGS) ×1
CLSR STERI-STRIP ANTIMIC 1/2X4 (GAUZE/BANDAGES/DRESSINGS) ×2 IMPLANT
COVER MAYO STAND STRL (DRAPES) ×3 IMPLANT
COVER PERINEAL POST (MISCELLANEOUS) ×3 IMPLANT
COVER SURGICAL LIGHT HANDLE (MISCELLANEOUS) ×3 IMPLANT
COVER WAND RF STERILE (DRAPES) ×3 IMPLANT
DRAPE STERI IOBAN 125X83 (DRAPES) ×3 IMPLANT
DRSG MEPILEX BORDER 4X4 (GAUZE/BANDAGES/DRESSINGS) ×6 IMPLANT
DURAPREP 26ML APPLICATOR (WOUND CARE) ×3 IMPLANT
ELECT REM PT RETURN 9FT ADLT (ELECTROSURGICAL) ×3
ELECTRODE REM PT RTRN 9FT ADLT (ELECTROSURGICAL) ×1 IMPLANT
GLOVE BIO SURGEON STRL SZ7.5 (GLOVE) ×6 IMPLANT
GLOVE BIOGEL PI IND STRL 8 (GLOVE) ×2 IMPLANT
GLOVE BIOGEL PI INDICATOR 8 (GLOVE) ×4
GOWN STRL REUS W/ TWL LRG LVL3 (GOWN DISPOSABLE) ×2 IMPLANT
GOWN STRL REUS W/TWL LRG LVL3 (GOWN DISPOSABLE) ×6
GUIDEROD T2 3X1000 (ROD) ×3 IMPLANT
K-WIRE  3.2X450M STR (WIRE) ×3
K-WIRE 3.2X450M STR (WIRE) ×1
KIT BASIN OR (CUSTOM PROCEDURE TRAY) ×3 IMPLANT
KIT TURNOVER KIT B (KITS) ×3 IMPLANT
KWIRE 3.2X450M STR (WIRE) ×1 IMPLANT
MANIFOLD NEPTUNE II (INSTRUMENTS) ×3 IMPLANT
NAIL LONG RT 1.5 10X340-125 (Nail) ×3 IMPLANT
NS IRRIG 1000ML POUR BTL (IV SOLUTION) ×3 IMPLANT
PACK GENERAL/GYN (CUSTOM PROCEDURE TRAY) ×3 IMPLANT
PAD ARMBOARD 7.5X6 YLW CONV (MISCELLANEOUS) ×6 IMPLANT
SCREW LAG GAMMA 3 TI 10.5X85MM (Screw) ×3 IMPLANT
STRIP CLOSURE SKIN 1/2X4 (GAUZE/BANDAGES/DRESSINGS) ×2 IMPLANT
SUT MNCRL AB 4-0 PS2 18 (SUTURE) ×3 IMPLANT
SUT MON AB 2-0 CT1 36 (SUTURE) ×3 IMPLANT
SUT VIC AB 2-0 CT1 27 (SUTURE) ×3
SUT VIC AB 2-0 CT1 TAPERPNT 27 (SUTURE) ×1 IMPLANT
TOWEL GREEN STERILE (TOWEL DISPOSABLE) ×3 IMPLANT
TOWEL OR NON WOVEN STRL DISP B (DISPOSABLE) ×3 IMPLANT
WATER STERILE IRR 1000ML POUR (IV SOLUTION) ×3 IMPLANT

## 2019-12-26 NOTE — Hospital Course (Addendum)
Send Lovenox 4 weeks to [facility] pharm   Left hip intertrochanteric fracture Terry Allison is a 74 year old female with past medical history of COPD, hypertension, anxiety, gout, CVA in 2015, depression, and hypothyroidism who presented to the hospital after a fall.  She denied any prodrome, lightheadedness or dizziness, loss of consciousness preceding the fall.  This seems to be a mechanical fall.  Patient fell the night before but decided to stay on the floor until the morning to call EMS when the pain did not improve.  X-ray of the left hip revealed intertrochanteric fracture.  Ortho were consulted and performed correction surgery.  There was no complications and the patient tolerated the procedure well. Pain was controlled with Tylenol, Norco, and Dilaudid.  Patient will be discharged with home health PT.  She has good support system at home with her partner and her son. No DEXA scan was found in the system.  Patient will need to follow-up with her PCP and Ortho after discharge.  Bisphosphonate will be started upon discharge.  PACs/PVCs Her EKG on admission showed heavy burden of PACs and PVCs which was not present on her EKG 2 years ago.  This is likely secondary to pain.  Her heart rate was within normal limits.  Patient was asymptomatic.  Telemetry also showed PVCs.  TSH came back normal at 4.2.  Echocardiogram shows accelerated blood flow to the left ventricular outflow tract, EF 70 to 75%, no regional wall motion abnormalities, grade 1 diastolic dysfunction, mildly elevated pulmonary artery systolic pressure, no significant valvular abnormalities.  Metoprolol was continued.  Macrocytic anemia Hemoglobin on admission 10.2, was approximately 12 in 2018.  MCV 105.3.  Patient does have history of drinking alcohol, which she drinks about 1 to 4 glasses of wine a night.  B12 came back normal at 296 and folate 17.5.  CIWA with Ativan was started due to history of alcohol use.

## 2019-12-26 NOTE — Anesthesia Procedure Notes (Addendum)
Procedure Name: Intubation Date/Time: 12/26/2019 3:18 PM Performed by: Lovie Chol, CRNA Pre-anesthesia Checklist: Patient identified, Emergency Drugs available, Suction available and Patient being monitored Patient Re-evaluated:Patient Re-evaluated prior to induction Oxygen Delivery Method: Circle System Utilized Preoxygenation: Pre-oxygenation with 100% oxygen Induction Type: IV induction Ventilation: Mask ventilation without difficulty Laryngoscope Size: Miller and 2 Grade View: Grade I Tube type: Oral Tube size: 7.0 mm Number of attempts: 1 Airway Equipment and Method: Stylet and Oral airway Placement Confirmation: ETT inserted through vocal cords under direct vision,  positive ETCO2 and breath sounds checked- equal and bilateral Secured at: 22 cm Tube secured with: Tape Dental Injury: Teeth and Oropharynx as per pre-operative assessment

## 2019-12-26 NOTE — Progress Notes (Signed)
  Echocardiogram 2D Echocardiogram has been performed.  Terry Allison 12/26/2019, 11:01 AM

## 2019-12-26 NOTE — Progress Notes (Signed)
  Date: 12/26/2019  Patient name: Terry Allison  Medical record number: [id number]  Date of birth: May 02, 1946   I have seen and evaluated Terry Allison and discussed their care with the Residency Team. Briefly, Terry Allison is a 74 year old woman with PMH of COPD, HTN, anxiety, gout, CVA, depression and hypothyroidism who presented after a mechanical fall and with a left hip fracture.  She notes that she was not using her cane and she believes her right knee gave out.  She had no prodrome, did not hit her head and did not lose consciousness.  She did not want to come to the hospital, but could not get up, so eventually EMS was called.  She was found to have a left hip fracture on Xrays.   Vitals:   12/25/19 1940 12/26/19 0727  BP: (!) 119/99 (!) 130/81  Pulse: 75 92  Resp: 17 17  Temp: 98.2 F (36.8 C) 98.3 F (36.8 C)  SpO2: 99% 93%   General: Elderly woman, NAD Eyes: Anicteric sclerae, no conjunctival injectin HENT: neck is supple, MMM CV: Irreg rhythm, normal rate, no murmur Pulm: Breathing comfortably, no wheezing Abd: soft, NT, ND MSK: She has an everted and shortened left lower extremity.  She has a knee brace on the right knee.  She is able to move foot without issue on the left.  She has tophi on the left first finger and left great toe Skin: She has varicose veins on the feet  Assessment and Plan: I have seen and evaluated the patient as outlined above. I agree with the formulated Assessment and Plan as detailed in the residents' note, with the following changes:   1. Left hip fracture - Pain control  - Orthopedics for surgery - NPO - Will need BP at discharge  2. H/O daily ETOH use - CIWA trend, no ativan.   - Reports that she has not had withdrawal before  Other issues per Terry Allison note.   Terry Catalina, MD 7/27/20212:16 PM

## 2019-12-26 NOTE — Progress Notes (Signed)
Subjective:   Hospital day: 1  Overnight event: No  Terry Allison is a 74 year old female with past medical history of COPD, hypertension, anxiety, gout, CVA in 2015, depression, and hypothyroidism presented to the hospital after a fall the night before and found to have left femoral intertrochanteric fracture. Patient examined at bedside today.  She appears pleasant and in no acute distress.  She reports continued pain which is incompletely improved by PO pain meds, better with IV pain meds. She is unsure why she fell, but suspects it may be due to her R knee and not using her cane.  She denied loss of consciousness or lightheadedness before the fall.     Objective:  Vital signs in last 24 hours: Vitals:   12/25/19 1715 12/25/19 1823 12/25/19 1940 12/26/19 0727  BP: (!) 109/97 (!) 157/78 (!) 119/99 (!) 130/81  Pulse: 61 67 75 92  Resp: 12 18 17 17   Temp:  97.7 F (36.5 C) 98.2 F (36.8 C) 98.3 F (36.8 C)  TempSrc:  Oral Oral Oral  SpO2: 94% 98% 99% 93%  Weight:  49.8 kg    Height:  5' (1.524 m)      Physical Exam  Physical Exam Constitutional:      General: She is not in acute distress.    Appearance: She is not toxic-appearing.  HENT:     Head: Normocephalic.  Eyes:     General: No scleral icterus.    Conjunctiva/sclera: Conjunctivae normal.  Cardiovascular:     Rate and Rhythm: Normal rate and regular rhythm.     Heart sounds: Normal heart sounds. No murmur heard.   Pulmonary:     Effort: Pulmonary effort is normal. No respiratory distress.     Breath sounds: Normal breath sounds.  Abdominal:     General: Bowel sounds are normal. There is no distension.     Palpations: Abdomen is soft.     Tenderness: There is no abdominal tenderness.  Musculoskeletal:     Comments: Left leg shortened and externally rotated.  She is able to move her left toes. Gouty tophi noted on right index finger and great toe on the right  Skin:    General: Skin is warm.      Findings: No bruising.  Neurological:     Mental Status: She is alert.  Psychiatric:        Mood and Affect: Mood normal.        Behavior: Behavior normal.     Assessment/Plan: Terry Allison is a 74 year old female with past medical history of COPD, hypertension, anxiety, gout, CVA in 2015, depression, and hypothyroidism presented to the hospital after a fall the night before and found to have left femoral intertrochanteric fracture.  Patient will undergo surgery today  Active Problems:   Closed left hip fracture (HCC)  Left hip intertrochanteric fracture The fall seems like mechanical in nature. Patient denies loss of consciousness or lightheadedness.  X-ray of the left hip shows femoral intertrochanteric fracture.  Patient will undergo surgery today.  No DEXA scan was found in the system.  Patient will need to be on bisphosphonate upon discharge. -Surgery today -Pain controll with Tylenol 650 mg every 6 as needed, Norco 5 mg every 6 as needed, and Dilaudid 0.5 mg IV every 4 as needed. -Patient will need to follow-up with PCP and start bisphosphonate at discharge   PACs/PVCs Unsure if this is the underlying cause of her fall.  Her EKG on admission shows PACs and  PVCs, which was not present on her EKG 2 years ago.  Heart rate was within normal limits.  Telemetry also shows PVCs.  TSH came back normal at 4.2.  Echocardiogram shows accelerated blood flow to the left ventricular outflow tract, EF 70 to 75%, no regional wall motion abnormalities, grade 1 diastolic dysfunction, mildly elevated pulmonary artery systolic pressure, no significant valvular abnormalities. -Continue to monitor -Continue metoprolol 25 mg twice daily   Macrocytic anemia History of alcohol use Hemoglobin on admission 10.2, was approximately 12 in 2018.  MCV 105.3.  Patient does have history of drinking alcohol, which she drinks about 1 to 4 glasses of wine a night.  B12 came back normal at 296 and folate  17.5. -Continue to monitor -CBC tomorrow -CIWA without Ativan.  Monitor for symptoms of withdrawal   Gout Tophi noted on fingers and toes.  Allopurinol was not on her home medication list.  Limited information obtained from chart review.  - Patient can consider probenecid or Uloric to help with tophi.   Hypothyroidism TSH 4.239. -Continue levothyroxine 25 mcg daily   Hypertension -Continue metoprolol 25 mg twice daily -Hold benazepril, chlorthalidone and Norvasc.  Current blood pressure 130/81   Anxiety Difficulty sleeping -Ativan 0.5 mg 3 times daily as needed   Diet: Regular IVF: No VTE: Heparin CODE: Full  Prior to Admission Living Arrangement: Home Anticipated Discharge Location: Home versus SNF Barriers to Discharge: Orthopedic surgery Dispo: Anticipated discharge in approximately 2-3 day(s).   Doran Stabler, DO 12/26/2019, 11:42 AM Pager: [phone] After 5pm on weekdays and 1pm on weekends: On Call pager [phone]

## 2019-12-26 NOTE — Op Note (Signed)
DATE OF SURGERY:  12/26/2019  TIME: 4:01 PM  PATIENT NAME:  Terry Allison  AGE: 74 y.o.  PRE-OPERATIVE DIAGNOSIS:  LEFT HIP FRACTURE  POST-OPERATIVE DIAGNOSIS:  SAME  PROCEDURE:  LEFT INTRAMEDULLARY  NAIL INTERTROCHANTRIC  SURGEON:  Sheral Apley  ASSISTANT:  Daun Peacock, PA-C, he was present and scrubbed throughout the case, critical for completion in a timely fashion, and for retraction, instrumentation, and closure.   OPERATIVE IMPLANTS: [employer] Gamma Nail  PREOPERATIVE INDICATIONS:  Terry Allison is a 74 y.o. year old who fell and suffered a hip fracture. She was brought into the ER and then admitted and optimized and then elected for surgical intervention.    The risks benefits and alternatives were discussed with the patient including but not limited to the risks of nonoperative treatment, versus surgical intervention including infection, bleeding, nerve injury, malunion, nonunion, hardware prominence, hardware failure, need for hardware removal, blood clots, cardiopulmonary complications, morbidity, mortality, among others, and they were willing to proceed.    OPERATIVE PROCEDURE:  The patient was brought to the operating room and placed in the supine position. General anesthesia was administered. She was placed on the fracture table.  Closed reduction was performed under C-arm guidance. Time out was then performed after sterile prep and drape. She received preoperative antibiotics.  Incision was made proximal to the greater trochanter. A guidewire was placed in the appropriate position. Confirmation was made on AP and lateral views. The above-named nail was opened. I opened the proximal femur with a reamer. I then placed the nail by hand easily down. I did not need to ream the femur.  Once the nail was completely seated, I placed a guidepin into the femoral head into the center center position. I measured the length, and then reamed the lateral cortex and up into  the head. I then placed the lag screw. Slight compression was applied. Anatomic fixation achieved. Bone quality was mediocre.  I then secured the proximal interlocking bolt, and took off a half a turn, and then removed the instruments, and took final C-arm pictures AP and lateral the entire length of the leg.   Anatomic reconstruction was achieved, and the wounds were irrigated copiously and closed with Vicryl followed by staples and sterile gauze for the skin. The patient was awakened and returned to PACU in stable and satisfactory condition. There no complications and the patient tolerated the procedure well.  She will be weightbearing as tolerated, and will be on chemical px  for a period of four weeks after discharge.   Terry Allison, M.D.

## 2019-12-26 NOTE — Interval H&P Note (Signed)
History and Physical Interval Note:  12/26/2019 6:43 AM  Terry Allison  has presented today for surgery, with the diagnosis of Left hip fxx.  The various methods of treatment have been discussed with the patient and family. After consideration of risks, benefits and other options for treatment, the patient has consented to  Procedure(s): INTRAMEDULLARY (IM) NAIL INTERTROCHANTRIC (Left) as a surgical intervention.  The patient's history has been reviewed, patient examined, no change in status, stable for surgery.  I have reviewed the patient's chart and labs.  Questions were answered to the patient's satisfaction.     Sheral Apley

## 2019-12-26 NOTE — Transfer of Care (Signed)
Immediate Anesthesia Transfer of Care Note  Patient: Terry Allison  Procedure(s) Performed: LEFT INTRAMEDULLARY  NAIL INTERTROCHANTRIC (Left Leg Upper)  Patient Location: PACU  Anesthesia Type:General  Level of Consciousness: awake, oriented and pateint uncooperative  Airway & Oxygen Therapy: Patient Spontanous Breathing and Patient connected to nasal cannula oxygen  Post-op Assessment: Report given to RN and Post -op Vital signs reviewed and stable  Post vital signs: Reviewed  Last Vitals:  Vitals Value Taken Time  BP 127/81 12/26/19 1626  Temp    Pulse 86 12/26/19 1631  Resp 14 12/26/19 1631  SpO2 96 % 12/26/19 1631  Vitals shown include unvalidated device data.  Last Pain:  Vitals:   12/26/19 1100  TempSrc:   PainSc: 8       Patients Stated Pain Goal: 1 (12/25/19 1525)  Complications: No complications documented.

## 2019-12-26 NOTE — Anesthesia Preprocedure Evaluation (Addendum)
Anesthesia Evaluation  Patient identified by MRN, date of birth, ID band Patient awake    Reviewed: Allergy & Precautions, NPO status , Patient's Chart, lab work & pertinent test results  Airway Mallampati: II  TM Distance: >3 FB Neck ROM: Full    Dental no notable dental hx. (+) Dental Advisory Given, Caps   Pulmonary COPD, former smoker,    Pulmonary exam normal breath sounds clear to auscultation       Cardiovascular hypertension, Normal cardiovascular exam Rhythm:Regular Rate:Normal     Neuro/Psych negative neurological ROS  negative psych ROS   GI/Hepatic negative GI ROS, (+)     substance abuse  alcohol use,   Endo/Other  Hypothyroidism   Renal/GU negative Renal ROS  negative genitourinary   Musculoskeletal negative musculoskeletal ROS (+)   Abdominal   Peds negative pediatric ROS (+)  Hematology  (+) anemia ,   Anesthesia Other Findings   Reproductive/Obstetrics negative OB ROS                            Anesthesia Physical Anesthesia Plan  ASA: III  Anesthesia Plan: General   Post-op Pain Management:    Induction: Intravenous  PONV Risk Score and Plan: 3 and Ondansetron, Dexamethasone and Treatment may vary due to age or medical condition  Airway Management Planned: Oral ETT  Additional Equipment:   Intra-op Plan:   Post-operative Plan: Extubation in OR  Informed Consent: I have reviewed the patients History and Physical, chart, labs and discussed the procedure including the risks, benefits and alternatives for the proposed anesthesia with the patient or authorized representative who has indicated his/her understanding and acceptance.     Dental advisory given  Plan Discussed with: CRNA and Surgeon  Anesthesia Plan Comments:         Anesthesia Quick Evaluation

## 2019-12-27 ENCOUNTER — Encounter (HOSPITAL_COMMUNITY): Payer: Self-pay | Admitting: Orthopedic Surgery

## 2019-12-27 LAB — CBC
HCT: 26.7 % — ABNORMAL LOW (ref 36.0–46.0)
Hemoglobin: 8.6 g/dL — ABNORMAL LOW (ref 12.0–15.0)
MCH: 33.1 pg (ref 26.0–34.0)
MCHC: 32.2 g/dL (ref 30.0–36.0)
MCV: 102.7 fL — ABNORMAL HIGH (ref 80.0–100.0)
Platelets: 263 10*3/uL (ref 150–400)
RBC: 2.6 MIL/uL — ABNORMAL LOW (ref 3.87–5.11)
RDW: 13.2 % (ref 11.5–15.5)
WBC: 8.2 10*3/uL (ref 4.0–10.5)
nRBC: 0 % (ref 0.0–0.2)

## 2019-12-27 LAB — MAGNESIUM: Magnesium: 1.5 mg/dL — ABNORMAL LOW (ref 1.7–2.4)

## 2019-12-27 LAB — BASIC METABOLIC PANEL
Anion gap: 11 (ref 5–15)
BUN: 19 mg/dL (ref 8–23)
CO2: 20 mmol/L — ABNORMAL LOW (ref 22–32)
Calcium: 8.9 mg/dL (ref 8.9–10.3)
Chloride: 104 mmol/L (ref 98–111)
Creatinine, Ser: 1.01 mg/dL — ABNORMAL HIGH (ref 0.44–1.00)
GFR calc Af Amer: >60 mL/min (ref >60)
GFR calc non Af Amer: 56 mL/min — ABNORMAL LOW (ref >60)
Glucose, Bld: 146 mg/dL — ABNORMAL HIGH (ref 70–99)
Potassium: 4.1 mmol/L (ref 3.5–5.1)
Sodium: 135 mmol/L (ref 135–145)

## 2019-12-27 LAB — URIC ACID: Uric Acid, Serum: 8.9 mg/dL — ABNORMAL HIGH (ref 2.5–7.1)

## 2019-12-27 MED ORDER — ENOXAPARIN SODIUM 30 MG/0.3ML ~~LOC~~ SOLN
30.0000 mg | SUBCUTANEOUS | Status: DC
  Administered 2019-12-27: 30 mg via SUBCUTANEOUS
  Filled 2019-12-27: qty 0.3

## 2019-12-27 MED ORDER — ALLOPURINOL 300 MG PO TABS
300.0000 mg | ORAL_TABLET | Freq: Every day | ORAL | Status: DC
  Administered 2019-12-27 – 2019-12-28 (×2): 300 mg via ORAL
  Filled 2019-12-27 (×2): qty 1
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
## 2019-12-27 NOTE — Anesthesia Postprocedure Evaluation (Signed)
Anesthesia Post Note  Patient: Terry Allison  Procedure(s) Performed: LEFT INTRAMEDULLARY  NAIL INTERTROCHANTRIC (Left Leg Upper)     Patient location during evaluation: PACU Anesthesia Type: General Level of consciousness: awake and alert Pain management: pain level controlled Vital Signs Assessment: post-procedure vital signs reviewed and stable Respiratory status: spontaneous breathing, nonlabored ventilation, respiratory function stable and patient connected to nasal cannula oxygen Cardiovascular status: blood pressure returned to baseline and stable Postop Assessment: no apparent nausea or vomiting Anesthetic complications: no   No complications documented.  Last Vitals:  Vitals:   12/27/19 0500 12/27/19 0733  BP: 112/70 (!) 124/62  Pulse: 81 71  Resp: 16 15  Temp: 36.7 C 36.4 C  SpO2: 97% 98%    Last Pain:  Vitals:   12/27/19 0830  TempSrc:   PainSc: 5                  Kourtnee Lahey S

## 2019-12-27 NOTE — Plan of Care (Signed)

## 2019-12-27 NOTE — Progress Notes (Signed)
Subjective:   Hospital day: 2  Overnight event: No  Patient is seen at bedside.  Patient undergone an uneventful surgery yesterday for her left hip fracture.  Patient states that her pain is much better at this time. Notes that her leg is stiff but has movement in it. Slept well overnight. Reports good appetite. Has had a lot of gas, but no BM yet.  Regarding her gout.  Patient is not taking allopurinol, and has tried some herbal medicines with tart cherry which did help. Usually only gets prescription meds (prednisone, colchicine causes diarrhea) during flares.  Objective:  Vital signs in last 24 hours: Vitals:   12/26/19 1700 12/26/19 1731 12/26/19 1953 12/27/19 0500  BP: (!) 132/67 (!) 143/75 126/78 112/70  Pulse: 86 82 86 81  Resp: 22 16 19 16   Temp: 98.5 F (36.9 C) 98.3 F (36.8 C) 98.5 F (36.9 C) 98.1 F (36.7 C)  TempSrc:  Oral Oral Axillary  SpO2: 94% 93% 96% 97%  Weight:      Height:        Physical Exam  Physical Exam Constitutional:      General: She is not in acute distress.    Appearance: She is not toxic-appearing.  HENT:     Head: Normocephalic.  Eyes:     General: No scleral icterus.    Conjunctiva/sclera: Conjunctivae normal.  Cardiovascular:     Rate and Rhythm: Normal rate and regular rhythm.     Heart sounds: Normal heart sounds. No murmur heard.   Pulmonary:     Effort: Pulmonary effort is normal. No respiratory distress.  Abdominal:     General: Abdomen is flat. Bowel sounds are normal. There is no distension.     Tenderness: There is no abdominal tenderness.  Musculoskeletal:     Comments: LLE neurovascularly intact.  Patient is able to move her left toes and hip   Neurological:     Mental Status: She is alert.  Psychiatric:        Mood and Affect: Mood normal.        Behavior: Behavior normal.     Assessment/Plan: Terry Allison is a 74 year old female with past medical history of COPD, hypertension, anxiety, gout, CVA in  2015, depression, and hypothyroidism presented to the hospital after a fall the night before and found to have left femoral intertrochanteric fracture.  Patient had correction surgery of elective yesterday.   Active Problems:   Closed displaced intertrochanteric fracture of left femur [facility])   Fall   Left hip intertrochanteric fracture Patient underwent correction surgery of the left hip yesterday.  Per Ortho notes, there was no complications and the patient tolerated the procedure well.  Patient appears clinically well.  PT OT today  -Appreciate PT/OT recommendations -Pain is well controlled with Tylenol 650 mg every 6 as needed, Norco 5 mg every 6 as needed, and Dilaudid 0.5 mg IV every 4 as needed. -Restarted pregabalin 75 mg twice daily -Patient will need to follow-up with PCP and start bisphosphonate at discharge   PACs/PVCs Echocardiogram shows accelerated blood flow to the left ventricular outflow tract, EF 70 to 75%, no regional wall motion abnormalities, grade 1 diastolic dysfunction, mildly elevated pulmonary artery systolic pressure, no significant valvular abnormalities.  These new PVCs are likely secondary to pain and stress reaction.  Pulse and blood pressure are still within normal limits. -Continue to monitor -Continue metoprolol 25 mg twice daily   Macrocytic anemia History of alcohol use Hemoglobin on admission  10.2, was approximately 12 in 2018.  MCV 105.3.  Patient does have history of drinking alcohol, which she drinks about 1 to 4 glasses of wine a night.  B12 came back normal at 296 and folate 17.5. -Continue to monitor -CBC tomorrow -CIWA without Ativan.  Monitor for symptoms of withdrawal CIWA score was zero last 24 hours   Gout Tophi noted on fingers and toes.   -Pending uric acid level -Started allopurinol 300 mg p.o. daily - Patient can consider probenecid or Uloric to help with tophi.   Hypothyroidism TSH 4.239. -Continue levothyroxine 25 mcg  daily   Hypertension -Continue metoprolol 25 mg twice daily -Continue benazepril 10 mg twice daily -Continue amlodipine 5 mg daily   Anxiety Difficulty sleeping -Ativan 0.5 mg 3 times daily as needed   Diet: Regular IVF: No VTE: Heparin CODE: Full  Prior to Admission Living Arrangement: Home Anticipated Discharge Location: Home versus SNF Barriers to Discharge:  PT OT Dispo: Anticipated discharge in approximately 2-3 day(s).    Doran Stabler, DO 12/27/2019, 6:30 AM Pager: [phone] After 5pm on weekdays and 1pm on weekends: On Call pager [phone]

## 2019-12-27 NOTE — Progress Notes (Signed)
Patient ID: Terry Allison, female   DOB: August 08, 1945, 74 y.o.   MRN: [id number]   LOS: 2 days   Subjective: Pain much improved after surgery. Slept well.   Objective: Vital signs in last 24 hours: Temp:  [97.6 F (36.4 C)-98.5 F (36.9 C)] 97.6 F (36.4 C) (07/28 0733) Pulse Rate:  [71-92] 71 (07/28 0733) Resp:  [15-22] 15 (07/28 0733) BP: (112-143)/(62-81) 124/62 (07/28 0733) SpO2:  [93 %-98 %] 98 % (07/28 0733) Weight:  [49.8 kg] 49.8 kg (07/27 1434) Last BM Date: 12/25/19   Laboratory  CBC Recent Labs    12/26/19 0235 12/27/19 0306  WBC 8.5 8.2  HGB 10.2* 8.6*  HCT 32.0* 26.7*  PLT 318 263   BMET Recent Labs    12/26/19 0649 12/27/19 0306  NA 135 135  K 4.5 4.1  CL 104 104  CO2 20* 20*  GLUCOSE 110* 146*  BUN 16 19  CREATININE 1.06* 1.01*  CALCIUM 9.4 8.9     Physical Exam General appearance: alert and no distress  LLE: Incisions clean and dry. Able to AROM unassisted to some extent.   Assessment/Plan: Left hip fx s/p IMN -- PT/OT today.    Freeman Caldron, PA-C Orthopedic Surgery [phone] 12/27/2019

## 2019-12-28 ENCOUNTER — Inpatient Hospital Stay (HOSPITAL_COMMUNITY): Payer: Medicare HMO

## 2019-12-28 LAB — BASIC METABOLIC PANEL
Anion gap: 8 (ref 5–15)
BUN: 31 mg/dL — ABNORMAL HIGH (ref 8–23)
CO2: 23 mmol/L (ref 22–32)
Calcium: 8.4 mg/dL — ABNORMAL LOW (ref 8.9–10.3)
Chloride: 99 mmol/L (ref 98–111)
Creatinine, Ser: 1.61 mg/dL — ABNORMAL HIGH (ref 0.44–1.00)
GFR calc Af Amer: 37 mL/min — ABNORMAL LOW (ref >60)
GFR calc non Af Amer: 32 mL/min — ABNORMAL LOW (ref >60)
Glucose, Bld: 115 mg/dL — ABNORMAL HIGH (ref 70–99)
Potassium: 4 mmol/L (ref 3.5–5.1)
Sodium: 130 mmol/L — ABNORMAL LOW (ref 135–145)

## 2019-12-28 LAB — URINALYSIS, ROUTINE W REFLEX MICROSCOPIC
Bilirubin Urine: NEGATIVE
Glucose, UA: NEGATIVE mg/dL
Hgb urine dipstick: NEGATIVE
Ketones, ur: NEGATIVE mg/dL
Nitrite: NEGATIVE
Protein, ur: NEGATIVE mg/dL
Specific Gravity, Urine: 1.008 (ref 1.005–1.030)
pH: 5 (ref 5.0–8.0)

## 2019-12-28 LAB — IRON AND TIBC
Iron: 22 ug/dL — ABNORMAL LOW (ref 28–170)
Saturation Ratios: 12 % (ref 10.4–31.8)
TIBC: 190 ug/dL — ABNORMAL LOW (ref 250–450)
UIBC: 168 ug/dL

## 2019-12-28 LAB — CBC
HCT: 24.5 % — ABNORMAL LOW (ref 36.0–46.0)
Hemoglobin: 7.9 g/dL — ABNORMAL LOW (ref 12.0–15.0)
MCH: 33.2 pg (ref 26.0–34.0)
MCHC: 32.2 g/dL (ref 30.0–36.0)
MCV: 102.9 fL — ABNORMAL HIGH (ref 80.0–100.0)
Platelets: 277 10*3/uL (ref 150–400)
RBC: 2.38 MIL/uL — ABNORMAL LOW (ref 3.87–5.11)
RDW: 13.3 % (ref 11.5–15.5)
WBC: 11.7 10*3/uL — ABNORMAL HIGH (ref 4.0–10.5)
nRBC: 0 % (ref 0.0–0.2)

## 2019-12-28 LAB — CREATININE, URINE, RANDOM: Creatinine, Urine: 54.07 mg/dL

## 2019-12-28 LAB — SODIUM, URINE, RANDOM: Sodium, Ur: 23 mmol/L

## 2019-12-28 LAB — RETICULOCYTES
Immature Retic Fract: 27.6 % — ABNORMAL HIGH (ref 2.3–15.9)
RBC.: 2.38 MIL/uL — ABNORMAL LOW (ref 3.87–5.11)
Retic Count, Absolute: 63.3 10*3/uL (ref 19.0–186.0)
Retic Ct Pct: 2.7 % (ref 0.4–3.1)

## 2019-12-28 LAB — FERRITIN: Ferritin: 162 ng/mL (ref 11–307)

## 2019-12-28 MED ORDER — ENOXAPARIN SODIUM 30 MG/0.3ML ~~LOC~~ SOLN: 30.0000 mg | SUBCUTANEOUS | 0 refills | Status: DC

## 2019-12-28 MED ORDER — LACTATED RINGERS IV SOLN: INTRAVENOUS | Status: DC

## 2019-12-28 MED ORDER — ALLOPURINOL 100 MG PO TABS
100.0000 mg | ORAL_TABLET | Freq: Every day | ORAL | Status: DC
  Administered 2019-12-29 – 2019-12-31 (×3): 100 mg via ORAL
  Filled 2019-12-28 (×3): qty 1

## 2019-12-28 MED ORDER — APIXABAN 2.5 MG PO TABS: 2.5000 mg | ORAL_TABLET | Freq: Two times a day (BID) | ORAL | 0 refills | Status: DC

## 2019-12-28 MED ORDER — LACTATED RINGERS IV SOLN: INTRAVENOUS | Status: AC

## 2019-12-28 MED ORDER — MAGNESIUM SULFATE 2 GM/50ML IV SOLN
2.0000 g | Freq: Once | INTRAVENOUS | Status: AC
  Administered 2019-12-28: 2 g via INTRAVENOUS
  Filled 2019-12-28: qty 50

## 2019-12-28 MED ORDER — APIXABAN 2.5 MG PO TABS
2.5000 mg | ORAL_TABLET | Freq: Two times a day (BID) | ORAL | Status: DC
  Administered 2019-12-28 – 2019-12-31 (×6): 2.5 mg via ORAL
  Filled 2019-12-28 (×6): qty 1

## 2019-12-28 MED FILL — ELIQUIS 2.5 MG TABLET: 2.5 | 28 days supply | Qty: 56 | Fill #0

## 2019-12-28 NOTE — TOC CAGE-AID Note (Signed)
Transition of Care [facility]) - CAGE-AID Screening   Patient Details  Name: Terry Allison MRN: [id number] Date of Birth: 04/14/1946  Transition of Care [facility]) CM/SW Contact:    Emeterio Reeve, [place] Phone Number: 12/28/2019, 4:30 PM   Clinical Narrative:  CSW met with pt at bedside. CSW introduced self and explained her role at the hospital.  Pt reports she has about 2 glasses of wine a night. Pt reports she used to drink alot more a day but has cut back per her PCP orders. Pt reports she plans on cutting back a lot more. Pt shared that her father was an alcoholic and she doesn't want to follow in his footsteps. Pt denied substance use.   Pt was receptive to counseling and Scientist, clinical (histocompatibility and immunogenetics). Pt declined resources. Pt reports she doesn't have a problem and she can cut back on her own.   CAGE-AID Screening:    Have You Ever Felt You Ought to Cut Down on Your Drinking or Drug Use?: Yes Have People Annoyed You By Critizing Your Drinking Or Drug Use?: Yes Have You Felt Bad Or Guilty About Your Drinking Or Drug Use?: No Have You Ever Had a Drink or Used Drugs First Thing In The Morning to Steady Your Nerves or to Get Rid of a Hangover?: No CAGE-AID Score: 2  Substance Abuse Education Offered: Yes  Substance abuse interventions: Patient Counseling, Educational Materials  Emeterio Reeve, Latanya Presser, [place] Social Worker [phone]
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
## 2019-12-28 NOTE — Plan of Care (Signed)

## 2019-12-28 NOTE — Progress Notes (Signed)
Orders noted for Magnesium and LR . Hung both and patient with c/o IV site burning. IVF's stopped and noted slight infiltration. Disconnected.and IV team consult entered.   Day shift Nurse updated.

## 2019-12-28 NOTE — Progress Notes (Signed)
Subjective:   Hospital day: 3  Overnight event: No  Patient is seen at bedside.  She is appear pleasant and in no acute distress.  Patient states that she feels much better this morning, pain is controlled except that her L knee hurts from when they got her up yesterday. Denies pain or bleeding otherwise, states "I feel wonderful." Sat in reclined for 4 hours, appreciated the change in position.  No BM yet, but increased fiber in diet. Having gas. Urinating without difficulty, no dysuria. No cough, sore throat, or fevers.   Objective:  Vital signs in last 24 hours: Vitals:   12/27/19 0733 12/27/19 1543 12/27/19 2005 12/28/19 0345  BP: (!) 124/62 (!) 104/55 (!) 111/59 (!) 112/61  Pulse: 71 72 66 58  Resp: 15 17 14 14   Temp: 97.6 F (36.4 C) 98.4 F (36.9 C) 98.6 F (37 C)   TempSrc: Oral Oral Oral   SpO2: 98% 99% 98% 99%  Weight:      Height:        Physical Exam  Physical Exam Constitutional:      General: She is not in acute distress.    Appearance: She is not toxic-appearing.  HENT:     Head: Normocephalic.  Eyes:     General: No scleral icterus.    Conjunctiva/sclera: Conjunctivae normal.  Cardiovascular:     Rate and Rhythm: Normal rate and regular rhythm.     Heart sounds: Normal heart sounds. No murmur heard.   Pulmonary:     Effort: Pulmonary effort is normal. No respiratory distress.     Comments: Diminished breath sound right lung base Abdominal:     General: Bowel sounds are normal. There is no distension.     Palpations: Abdomen is soft.     Tenderness: There is no abdominal tenderness.  Musculoskeletal:     Comments: Mild edema noted at left hip.  No pain to palpation  Neurological:     Mental Status: She is alert.  Psychiatric:        Mood and Affect: Mood normal.        Behavior: Behavior normal.     Assessment/Plan: Terry Allison is a 74 year old female with past medical history of COPD, hypertension, anxiety, gout, CVA in 2015,  depression, and hypothyroidism presented to the hospital after a fall the night before and found to have left femoral intertrochanteric fracture. Patient had correction surgery of elective and is planned to go home with home health PT when stable.   Active Problems:   Closed displaced intertrochanteric fracture of left femur [facility])   Fall   Left hip intertrochanteric fracture Patient underwent correction surgery of the left hip. Per Ortho notes, there was no complications and the patient tolerated the procedure well.    Patient is doing well.  PT OT recommended home health PT.  Patient will be going home in stable  -Pain is well controlled with Tylenol 650 mg every 6 as needed, Norco 5 mg every 6 as needed, and Dilaudid 0.5 mg IV every 4 as needed.  She has not had Dilaudid since 7/27 -Restarted pregabalin 75 mg twice daily -Patient will need to follow-up with PCP and start bisphosphonate at discharge   Macrocytic anemia Hemoglobin is trending down from 8.6-7.9 today.  Unclear etiology.  Patient is asymptomatic and has no complaint.  Blood pressure is a little soft at 103/63.  Will order CT of left leg and abdomen to evaluate any internal hemorrhage.  Also check  iron studies. -Pending CT left hip and abdomen -Pending iron studies   Elevated creatinine Creatinine is elevated to 1.61 from 1.01 yesterday.  BUN also increased to 31 from 19 yesterday.  Underlying cause is likely urinary retention.  Will check bladder scan.  Patient has a pure wick catheter.  Also check UA, urine sodium and creatinine to calculate Fena. -Pending urine study -Hold her ACE inhibitor   PACs/PVCs Echocardiogram shows accelerated blood flow to the left ventricular outflow tract, EF 70 to 75%, no regional wall motion abnormalities, grade 1 diastolic dysfunction, mildly elevated pulmonary artery systolic pressure, no significant valvular abnormalities.  These new PVCs are likely secondary to pain and stress  reaction.  Pulse and blood pressure are still within normal limits. -Continue to monitor -Continue metoprolol 25 mg twice daily   Gout Tophi noted on fingers and toes.  -Pending uric acid level -Started allopurinol 300 mg p.o. daily -Patient can consider probenecid or Uloric to help with tophi.   Hypothyroidism TSH 4.239. -Continue levothyroxine 25 mcg daily   Hypertension -Continue metoprolol 25 mg twice daily -Continue benazepril 10 mg twice daily -Continue amlodipine 5 mg daily   Anxiety Difficulty sleeping -Ativan 0.5 mg 3 times daily as needed   Diet:Regular IVF:No SPQ:ZRAQTMA CODE:Full  Prior to Admission Living Arrangement:Home Anticipated Discharge Location:Home versus SNF Barriers to Discharge: PT OT Dispo: Anticipated discharge in approximately2-3day(s).   Doran Stabler, DO 12/28/2019, 6:15 AM Pager: [phone] After 5pm on weekdays and 1pm on weekends: On Call pager [phone]

## 2019-12-28 NOTE — Plan of Care (Signed)
  Problem: Clinical Measurements: Goal: Ability to maintain clinical measurements within normal limits will improve Outcome: Progressing   Problem: Clinical Measurements: Goal: Will remain free from infection Outcome: Progressing   Problem: Activity: Goal: Risk for activity intolerance will decrease Outcome: Progressing   Problem: Coping: Goal: Level of anxiety will decrease Outcome: Progressing   Problem: Pain Managment: Goal: General experience of comfort will improve Outcome: Progressing   Problem: Safety: Goal: Ability to remain free from injury will improve Outcome: Progressing

## 2019-12-29 ENCOUNTER — Inpatient Hospital Stay (HOSPITAL_COMMUNITY): Payer: Medicare HMO

## 2019-12-29 DIAGNOSIS — M109 Gout, unspecified: Secondary | ICD-10-CM | POA: Diagnosis present

## 2019-12-29 DIAGNOSIS — M25579 Pain in unspecified ankle and joints of unspecified foot: Secondary | ICD-10-CM

## 2019-12-29 LAB — CBC
HCT: 24.4 % — ABNORMAL LOW (ref 36.0–46.0)
Hemoglobin: 7.8 g/dL — ABNORMAL LOW (ref 12.0–15.0)
MCH: 34.2 pg — ABNORMAL HIGH (ref 26.0–34.0)
MCHC: 32 g/dL (ref 30.0–36.0)
MCV: 107 fL — ABNORMAL HIGH (ref 80.0–100.0)
Platelets: 292 10*3/uL (ref 150–400)
RBC: 2.28 MIL/uL — ABNORMAL LOW (ref 3.87–5.11)
RDW: 13.7 % (ref 11.5–15.5)
WBC: 9.1 10*3/uL (ref 4.0–10.5)
nRBC: 0 % (ref 0.0–0.2)

## 2019-12-29 LAB — BASIC METABOLIC PANEL
Anion gap: 10 (ref 5–15)
BUN: 30 mg/dL — ABNORMAL HIGH (ref 8–23)
CO2: 20 mmol/L — ABNORMAL LOW (ref 22–32)
Calcium: 8.6 mg/dL — ABNORMAL LOW (ref 8.9–10.3)
Chloride: 106 mmol/L (ref 98–111)
Creatinine, Ser: 1.45 mg/dL — ABNORMAL HIGH (ref 0.44–1.00)
GFR calc Af Amer: 42 mL/min — ABNORMAL LOW (ref >60)
GFR calc non Af Amer: 36 mL/min — ABNORMAL LOW (ref >60)
Glucose, Bld: 97 mg/dL (ref 70–99)
Potassium: 4.2 mmol/L (ref 3.5–5.1)
Sodium: 136 mmol/L (ref 135–145)

## 2019-12-29 MED ORDER — SODIUM CHLORIDE 0.9 % IV SOLN
510.0000 mg | Freq: Once | INTRAVENOUS | Status: AC
  Administered 2019-12-29: 510 mg via INTRAVENOUS
  Filled 2019-12-29: qty 17

## 2019-12-29 MED ORDER — PREDNISONE 20 MG PO TABS
40.0000 mg | ORAL_TABLET | Freq: Every day | ORAL | Status: DC
  Administered 2019-12-29 – 2019-12-31 (×3): 40 mg via ORAL
  Filled 2019-12-29 (×3): qty 2

## 2019-12-29 MED ORDER — LACTATED RINGERS IV SOLN: INTRAVENOUS | Status: AC

## 2019-12-29 MED ORDER — POLYETHYLENE GLYCOL 3350 17 G PO PACK
17.0000 g | PACK | Freq: Every day | ORAL | Status: DC | PRN
  Filled 2019-12-29: qty 1

## 2019-12-29 MED ORDER — HYDROMORPHONE HCL 1 MG/ML IJ SOLN: 0.5000 mg | Freq: Three times a day (TID) | INTRAMUSCULAR | Status: DC | PRN
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
## 2019-12-29 NOTE — Progress Notes (Signed)
The patient co having pain in the left ankle area when ambulating

## 2019-12-29 NOTE — Plan of Care (Signed)
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
## 2019-12-30 LAB — BASIC METABOLIC PANEL
Anion gap: 9 (ref 5–15)
BUN: 22 mg/dL (ref 8–23)
CO2: 23 mmol/L (ref 22–32)
Calcium: 8.9 mg/dL (ref 8.9–10.3)
Chloride: 106 mmol/L (ref 98–111)
Creatinine, Ser: 1.09 mg/dL — ABNORMAL HIGH (ref 0.44–1.00)
GFR calc Af Amer: 59 mL/min — ABNORMAL LOW (ref >60)
GFR calc non Af Amer: 51 mL/min — ABNORMAL LOW (ref >60)
Glucose, Bld: 110 mg/dL — ABNORMAL HIGH (ref 70–99)
Potassium: 4.2 mmol/L (ref 3.5–5.1)
Sodium: 138 mmol/L (ref 135–145)

## 2019-12-30 LAB — CBC
HCT: 23 % — ABNORMAL LOW (ref 36.0–46.0)
Hemoglobin: 7.4 g/dL — ABNORMAL LOW (ref 12.0–15.0)
MCH: 33.9 pg (ref 26.0–34.0)
MCHC: 32.2 g/dL (ref 30.0–36.0)
MCV: 105.5 fL — ABNORMAL HIGH (ref 80.0–100.0)
Platelets: 283 10*3/uL (ref 150–400)
RBC: 2.18 MIL/uL — ABNORMAL LOW (ref 3.87–5.11)
RDW: 13.5 % (ref 11.5–15.5)
WBC: 8 10*3/uL (ref 4.0–10.5)
nRBC: 0 % (ref 0.0–0.2)

## 2019-12-30 NOTE — Progress Notes (Signed)
° °  Subjective: Feeling better this morning. Prednisone has significantly helped her ankle pain and she is able to move her leg much better.  She is still wanting to go home at discharge since she will have support at home and is able to use a walker to get around. She will see how PT goes today and is hopeful to go home tomorrow.   Objective:  Vital signs in last 24 hours: Vitals:   12/29/19 0500 12/29/19 0928 12/29/19 1417 12/29/19 2011  BP: (!) 123/52 (!) 123/52 (!) 111/61 (!) 125/51  Pulse: 64 64 72 92  Resp: 16  17 17   Temp: 97.7 F (36.5 C)  98.6 F (37 C) 98.7 F (37.1 C)  TempSrc: Oral  Oral Oral  SpO2: 95%  97% 98%  Weight:      Height:       General: awake, alert, sitting up in bed in NAD MSK: left ankle non-tender to palpation and with full ROM   Assessment/Plan: Terry Allison is a 74 year old female with past medical history of COPD, hypertension, anxiety, gout, CVA in 2015, depression, and hypothyroidism presented to the hospital after a fall the night before and found to have left femoral intertrochanteric fracture. Patient underwent surgical repairand is planned to go home with home health PT when stable.   Principal Problem:   Closed displaced intertrochanteric fracture of left femur (HCC) Active Problems:   HTN (hypertension)   Fall   Gout flare   Ankle pain  Left hip intertrochanteric fracture  S/p intramedullary nail intertrochanteric repair -POD4 -progressing with PT/OT; recommendations were for rehab, but patient prefers to go home and feels comfortable given her single floor condo allowing her to get around with a walker and good family support -she will be on Eliquis 2.5 mg BID for 4 weeks for DVT ppx -will plan to start bisphosphonate at discharge given that she sustained a fragility fracture   Macrocytic anemia -Hemoglobin remains stable at 7.4 -received dose of IV feraheme yesterday -no evidence of postop bleeding -B12 and folate were  normal; MMA pending  -continue to trend CBCs   AKI -creatinine improved after receiving IVF -decreased allopurinol dose to 100 mg  -trend BMPs   Acute gout flare of left ankle -significantly improved with initiation of Prednisone; will continue for 5 day course -continue allopurinol 100 mg daily   PACs/PVCs Echocardiogram shows accelerated blood flow to the left ventricular outflow tract, EF 70 to 75%, no regional wall motion abnormalities, grade 1 diastolic dysfunction, mildly elevated pulmonary artery systolic pressure, no significant valvular abnormalities.These new PVCs are likely secondary to pain and stress reaction. Pulse and blood pressure are still within normal limits. -Continue to monitor -Continue metoprolol 25 mg twice daily   Hypothyroidism TSH 4.239. -Continue levothyroxine 25 mcg daily   Hypertension -Continue metoprolol 25 mg twice daily -Continue amlodipine 5 mg daily   Anxiety Difficulty sleeping -Ativan 0.5 mg 3 times daily as needed  Dispo: Anticipated discharge in approximately 1 day(s).   2016, DO 12/30/2019, 6:53 AM Pager: [phone] After 5pm on weekdays and 1pm on weekends: On Call pager [phone]
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
## 2019-12-30 NOTE — Plan of Care (Signed)
  Problem: Education: Goal: Knowledge of General Education information will improve Description: Including pain rating scale, medication(s)/side effects and non-pharmacologic comfort measures Outcome: Progressing   Problem: Clinical Measurements: Goal: Ability to maintain clinical measurements within normal limits will improve Outcome: Progressing   Problem: Activity: Goal: Risk for activity intolerance will decrease Outcome: Progressing   Problem: Coping: Goal: Level of anxiety will decrease Outcome: Progressing   Problem: Pain Managment: Goal: General experience of comfort will improve Outcome: Progressing   Problem: Safety: Goal: Ability to remain free from injury will improve Outcome: Progressing   Problem: Skin Integrity: Goal: Risk for impaired skin integrity will decrease Outcome: Progressing   

## 2019-12-31 LAB — BASIC METABOLIC PANEL
Anion gap: 10 (ref 5–15)
BUN: 21 mg/dL (ref 8–23)
CO2: 23 mmol/L (ref 22–32)
Calcium: 9.3 mg/dL (ref 8.9–10.3)
Chloride: 105 mmol/L (ref 98–111)
Creatinine, Ser: 0.97 mg/dL (ref 0.44–1.00)
GFR calc Af Amer: >60 mL/min (ref >60)
GFR calc non Af Amer: 58 mL/min — ABNORMAL LOW (ref >60)
Glucose, Bld: 107 mg/dL — ABNORMAL HIGH (ref 70–99)
Potassium: 4 mmol/L (ref 3.5–5.1)
Sodium: 138 mmol/L (ref 135–145)

## 2019-12-31 LAB — CBC
HCT: 24.1 % — ABNORMAL LOW (ref 36.0–46.0)
Hemoglobin: 7.8 g/dL — ABNORMAL LOW (ref 12.0–15.0)
MCH: 34.1 pg — ABNORMAL HIGH (ref 26.0–34.0)
MCHC: 32.4 g/dL (ref 30.0–36.0)
MCV: 105.2 fL — ABNORMAL HIGH (ref 80.0–100.0)
Platelets: 343 10*3/uL (ref 150–400)
RBC: 2.29 MIL/uL — ABNORMAL LOW (ref 3.87–5.11)
RDW: 13.7 % (ref 11.5–15.5)
WBC: 8.6 10*3/uL (ref 4.0–10.5)
nRBC: 0.2 % (ref 0.0–0.2)

## 2019-12-31 MED ORDER — ALLOPURINOL 100 MG PO TABS: 100.0000 mg | ORAL_TABLET | Freq: Every day | ORAL | 0 refills | Status: AC

## 2019-12-31 MED ORDER — POLYETHYLENE GLYCOL 3350 17 G PO PACK: 17.0000 g | PACK | Freq: Every day | ORAL | 0 refills | Status: DC | PRN

## 2019-12-31 MED ORDER — ALENDRONATE SODIUM 70 MG PO TABS
70.0000 mg | ORAL_TABLET | ORAL | 11 refills | Status: AC
Start: 2019-12-31 — End: 2020-12-30

## 2019-12-31 MED ORDER — SENNOSIDES-DOCUSATE SODIUM 8.6-50 MG PO TABS: 1.0000 | ORAL_TABLET | Freq: Every evening | ORAL | 0 refills | Status: DC | PRN

## 2019-12-31 MED ORDER — HYDROCODONE-ACETAMINOPHEN 5-325 MG PO TABS: 1.0000 | ORAL_TABLET | Freq: Four times a day (QID) | ORAL | 0 refills | Status: AC | PRN

## 2019-12-31 MED ORDER — PREDNISONE 20 MG PO TABS: 40.0000 mg | ORAL_TABLET | Freq: Every day | ORAL | 0 refills | Status: AC

## 2019-12-31 NOTE — Progress Notes (Signed)
Discharge package printed and instruction given. Verbalizing understand.

## 2019-12-31 NOTE — Discharge Summary (Signed)
Name: Terry Allison MRN: [id number] DOB: [birth year] 74 y.o. PCP: Hayden Rasmussen, MD  Date of Admission: 12/25/2019  9:37 AM Date of Discharge: 12/31/2019 Attending Physician: Axel Filler, MD  Discharge Diagnosis: 1. Left hip fracture after mechanical fall 2. S/p intramedullary nail intertrochanteric repair  3. Acute gout flare   Discharge Medications: Allergies as of 12/31/2019      Reactions   Tramadol Nausea And Vomiting   Falling down       Medication List    STOP taking these medications   benazepril 20 MG tablet Commonly known as: LOTENSIN   celecoxib 200 MG capsule Commonly known as: CeleBREX   chlorthalidone 25 MG tablet Commonly known as: HYGROTON   naproxen sodium 220 MG tablet Commonly known as: ALEVE     TAKE these medications   Albuterol Sulfate 108 (90 Base) MCG/ACT Aepb Commonly known as: ProAir RespiClick Inhale 1 Inhaler into the lungs every 4 (four) hours as needed. What changed:   how much to take  reasons to take this   alendronate 70 MG tablet Commonly known as: Fosamax Take 1 tablet (70 mg total) by mouth every 7 (seven) days. Take with a full glass of water on an empty stomach.   allopurinol 100 MG tablet Commonly known as: ZYLOPRIM Take 1 tablet (100 mg total) by mouth daily.   amLODipine 5 MG tablet Commonly known as: NORVASC Take 1 tablet (5 mg total) by mouth daily. No more refills without office visit   apixaban 2.5 MG Tabs tablet Commonly known as: ELIQUIS Take 1 tablet (2.5 mg total) by mouth 2 (two) times daily for 28 days.   aspirin EC 81 MG tablet Take 81 mg by mouth daily.   atorvastatin 40 MG tablet Commonly known as: LIPITOR TAKE 1 TABLET BY MOUTH EVERY DAY   Blood Pressure Kit Devi 1 Units by Does not apply route once a week.   calcipotriene 0.005 % cream Commonly known as: DOVONOX Apply 1 application topically in the morning.   cholecalciferol 1000 units tablet Commonly known as: VITAMIN  D Take 1,000 Units by mouth daily.   fluticasone 50 MCG/ACT nasal spray Commonly known as: FLONASE Place 2 sprays into both nostrils 2 (two) times daily. What changed:   when to take this  reasons to take this   HYDROcodone-acetaminophen 5-325 MG tablet Commonly known as: NORCO/VICODIN Take 1 tablet by mouth every 6 (six) hours as needed for up to 7 days for moderate pain.   ipratropium 0.03 % nasal spray Commonly known as: ATROVENT PLACE 2 SPRAYS INTO THE NOSE 4 (FOUR) TIMES DAILY. What changed:   when to take this  reasons to take this   levothyroxine 75 MCG tablet Commonly known as: SYNTHROID TAKE 1 TABLET BY MOUTH EVERY DAY What changed: when to take this   LORazepam 1 MG tablet Commonly known as: Ativan Take 1 tablet (1 mg total) by mouth 3 (three) times daily as needed for anxiety.   metoprolol tartrate 50 MG tablet Commonly known as: LOPRESSOR TAKE 1 TABLET (50 MG TOTAL) BY MOUTH 2 (TWO) TIMES DAILY. What changed: See the new instructions.   mirtazapine 15 MG tablet Commonly known as: REMERON Take 15 mg by mouth at bedtime.   omeprazole 20 MG capsule Commonly known as: PRILOSEC Take 20 mg by mouth daily as needed (acid reflux).   polyethylene glycol 17 g packet Commonly known as: MIRALAX / GLYCOLAX Take 17 g by mouth daily as needed for moderate  constipation or severe constipation.   predniSONE 20 MG tablet Commonly known as: DELTASONE Take 2 tablets (40 mg total) by mouth daily for 3 days.   pregabalin 75 MG capsule Commonly known as: LYRICA Take 75 mg by mouth 2 (two) times daily.   Prenatal Vitamin Plus Low Iron 27-1 MG Tabs Take 1 tablet by mouth every evening.   senna-docusate 8.6-50 MG tablet Commonly known as: Senokot-S Take 1 tablet by mouth at bedtime as needed for mild constipation.   thiamine 100 MG tablet Commonly known as: Vitamin B-1 Take 100 mg by mouth daily.   triamcinolone ointment 0.1 % Commonly known as: KENALOG Apply  1 application topically as needed (skin irritation due to covid vaccine / psoriasis.).       Disposition and follow-up:   Terry Allison was discharged from [facility] in Stable condition.  At the hospital follow up visit please address:  1.  Hip fracture: Evaluate how home PT is going. Ensure she has follow-up with orthopedic surgeon Raliegh Ip). Started on bisphosphonate therapy for fragility fracture. Acute gout flare: treated with 5 days of prednisone and started on uric acid lowering therapy with allopurinol   2.  Labs / imaging needed at time of follow-up: CBC, BMP  3.  Pending labs/ test needing follow-up: none  Follow-up Appointments:  Follow-up Information    [facility] Follow up.   Specialty: Home Health Services Why: This agency will contact you to arrange home physical therapy visits.  Contact information: [address] [place] [place] [address] [phone]        Renette Butters, MD. Schedule an appointment as soon as possible for a visit in 2 week(s).   Specialty: Orthopedic Surgery Contact information: [address] [place] [address] [phone]        Hayden Rasmussen, MD. Schedule an appointment as soon as possible for a visit in 1 week(s).   Specialty: Family Medicine Contact information: [address] [place] [address] [phone]               Hospital Course by problem list: Terry Allison is a 74 year old female with past medical history of COPD, hypertension, anxiety, gout, CVA in 2015, depression, and hypothyroidism presented to the hospital after a fall the night before and found to have left femoral intertrochanteric fracture. Patient underwent surgical repair. She recovered well postoperatively and elected to go home to complete PT as opposed to rehab facility. and is planned to go home with home health PT based on patient preference.    1. Left hip intertrochanteric fracture S/p intramedullary nail intertrochanteric repair Patient progressed well with PT/OT and pain was well controlled on oral analgesics. Recommendations were for rehab, but patient preferred to go home given that she lives in a single floor condo and has good family support. She will stay on Eliquis 2.5 mg BID for 4 weeks for DVT prophylaxis. We started weekly alendronate since she presented with a fragility fracture.   2. Acute on chronic anemia: blood counts trended down postoperatively but never required transfusion. She did receive a dose of IV feraheme to boost her iron stores. She did not show any signs of bleeding. Hgb at discharge was 7.8. Continue to monitor outpatient.   3. Acute gout flare of left ankle: This responded quite rapidly to Prednisone which we continued at discharge to complete a 5 day course. She was also started on uric  acid lowering therapy to prevent flares in the future.   4. HTN: thiazide diuretic and ACE inhibitor held at discharge given her AKI during hospitalization. Also did not want to increase risk of falls due to orthostatic hypotension. Can be resumed as indicated outpatient.   Discharge Vitals:   BP 127/66 (BP Location: Left Arm)    Pulse 67    Temp 98.5 F (36.9 C) (Oral)    Resp 17    Ht 5' (1.524 m)    Wt 49.8 kg    SpO2 100%    BMI 21.44 kg/m   Pertinent Labs, Studies, and Procedures:  CBC Latest Ref Rng & Units 12/31/2019 12/30/2019 12/29/2019  WBC 4.0 - 10.5 K/uL 8.6 8.0 9.1  Hemoglobin 12.0 - 15.0 g/dL 7.8(L) 7.4(L) 7.8(L)  Hematocrit 36 - 46 % 24.1(L) 23.0(L) 24.4(L)  Platelets 150 - 400 K/uL 343 283 292   BMP Latest Ref Rng & Units 12/31/2019 12/30/2019 12/29/2019  Glucose 70 - 99 mg/dL 107(H) 110(H) 97  BUN 8 - 23 mg/dL 21 22 30(H)  Creatinine 0.44 - 1.00 mg/dL 0.97 1.09(H) 1.45(H)  BUN/Creat Ratio 12 - 28 - - -  Sodium 135 - 145 mmol/L 138 138 136  Potassium 3.5 - 5.1 mmol/L 4.0 4.2 4.2  Chloride 98 - 111  mmol/L 105 106 106  CO2 22 - 32 mmol/L 23 23 20(L)  Calcium 8.9 - 10.3 mg/dL 9.3 8.9 8.6(L)     Discharge Instructions: Discharge Instructions    Call MD for:  persistant dizziness or light-headedness   Complete by: As directed    Call MD for:  persistant nausea and vomiting   Complete by: As directed    Call MD for:  redness, tenderness, or signs of infection (pain, swelling, redness, odor or green/yellow discharge around incision site)   Complete by: As directed    Call MD for:  severe uncontrolled pain   Complete by: As directed    Call MD for:  temperature >100.4   Complete by: As directed    Diet - low sodium heart healthy   Complete by: As directed    Discharge instructions   Complete by: As directed    Ms. Royle, It was a pleasure taking care of you. I'm glad you have recovered well from your hip surgery and hope you continue to recover well at home. We will arrange for PT to continue working with you at your condo.  Please continue taking the blood thinner (eliquis) twice daily. I have also continued a short course of pain medications to take as needed. We are starting a medication to take once per week to help with bone strength.   Be sure to follow-up with your surgeon as scheduled.   Continue taking 3 more days of Prednisone to treat your gout flare.   Take care!   Increase activity slowly   Complete by: As directed    No wound care   Complete by: As directed       Signed: Delice Bison, DO 01/01/2020, 11:34 AM   Pager: [phone]

## 2019-12-31 NOTE — TOC Progression Note (Signed)
Transition of Care [facility]) - Progression Note    Patient Details  Name: Terry Allison MRN: [id number] Date of Birth: Mar 29, 1946  Transition of [facility]) CM/SW Contact  Deveron Furlong, RN 12/31/2019, 11:12 AM  Clinical Narrative:    Patient to go home with support of significant other and son.  Patient has a non-rolling walker and a rollator, but would like a rolling walker and 3n1.  These are ordered from [employer] for delivery to room prior to d/c.  Patient plans to leave later this afternoon.  Patient used [facility] agency in 2015, but unable to remember name.  Patient is open to other agencies.  Kandee Keen with [facility] accepted referral.    Expected Discharge Plan: Home w Home Health Services Barriers to Discharge: No Barriers Identified  Expected Discharge Plan and Services Expected Discharge Plan: Home w Home Health Services   Discharge Planning Services: CM Consult Post Acute Care Choice: Home Health Living arrangements for the past 2 months: Single Family Home                 DME Arranged: 3-N-1, Walker rolling DME Agency: [employer] Date DME Agency Contacted: 12/31/19 Time DME Agency Contacted: 1110 Representative spoke with at DME Agency: [place] HH Arranged: PT, OT HH Agency: [facility] Date [facility] Agency Contacted: 12/31/19 Time HH Agency Contacted: 1111 Representative spoke with at [facility] Agency: Kandee Keen

## 2019-12-31 NOTE — Progress Notes (Addendum)
   Subjective: No acute events overnight. Worked well with PT yesterday. Left ankle no longer painful. Hip pain well controlled on PO analgesics.   Objective:  Vital signs in last 24 hours: Vitals:   12/29/19 2011 12/30/19 1528 12/30/19 1917 12/31/19 0349  BP: (!) 125/51 (!) 117/62 123/65 (!) 137/75  Pulse: 92 83 58 69  Resp: 17  17 17   Temp: 98.7 F (37.1 C) 98.8 F (37.1 C) 98.4 F (36.9 C) 97.8 F (36.6 C)  TempSrc: Oral Oral Oral Oral  SpO2: 98% 99% 98% 98%  Weight:      Height:       General: awake, alert, sitting up in bed in NAD Ext: left ankle with full ROM and is non-tender on exam. Left hip surgical site c/d/i.   Assessment/Plan: Terry Allison is a 74 year old female with past medical history of COPD, hypertension, anxiety, gout, CVA in 2015, depression, and hypothyroidism presented to the hospital after a fall the night before and found to have left femoral intertrochanteric fracture. Patient underwent surgical repairand is planned to go home with home health PT based on patient preference.   Principal Problem:   Closed displaced intertrochanteric fracture of left femur (HCC) Active Problems:   HTN (hypertension)   Fall   Gout flare   Ankle pain  Left hip intertrochanteric fracture  S/p intramedullary nail intertrochanteric repair -POD5 -progressing with PT/OT; recommendations were for rehab, but patient prefers to go home and feels comfortable given her single floor condo allowing her to get around with a walker and good family support -she will be on Eliquis 2.5 mg BID for 4 weeks for DVT ppx -start alendronate for bisphosphonate therapy  -home health services arranged   Macrocytic anemia -Hemoglobin remains stable at 7.8 -received dose of IV feraheme during admission -no evidence of postop bleeding -B12 and folate were normal -f/u CBC at PCP visit   AKI - resolved -creatinine improved after receiving IVF -decreased allopurinol dose to 100 mg    -repeat BMP at follow-up PCP visit   Acute gout flare of left ankle -significantly improved with initiation of Prednisone; will continue for 5 day course -continue allopurinol 100 mg daily   Dispo: Anticipated discharge home today.   2016 D, DO 12/31/2019, 6:30 AM Pager: [phone] After 5pm on weekdays and 1pm on weekends: On Call pager [phone]   Internal Medicine Attending:   I saw and examined the patient. I reviewed the resident's note and I agree with the resident's findings and plan as documented in the resident's note.

## 2020-01-22 ENCOUNTER — Other Ambulatory Visit: Payer: Self-pay | Admitting: Internal Medicine

## 2020-02-16 ENCOUNTER — Ambulatory Visit (HOSPITAL_COMMUNITY)
Admission: RE | Admit: 2020-02-16 | Discharge: 2020-02-16 | Disposition: A | Discharge status: Home / Self Care | Payer: Medicare HMO | Source: Ambulatory Visit | Attending: Cardiovascular Disease | Admitting: Cardiovascular Disease

## 2020-02-16 ENCOUNTER — Other Ambulatory Visit: Payer: Self-pay

## 2020-02-16 ENCOUNTER — Other Ambulatory Visit (HOSPITAL_COMMUNITY): Payer: Self-pay | Admitting: Family Medicine

## 2020-02-16 DIAGNOSIS — R600 Localized edema: Secondary | ICD-10-CM

## 2020-02-16 DIAGNOSIS — M79669 Pain in unspecified lower leg: Secondary | ICD-10-CM

## 2020-02-16 DIAGNOSIS — M7989 Other specified soft tissue disorders: Secondary | ICD-10-CM | POA: Insufficient documentation

## 2020-03-24 ENCOUNTER — Other Ambulatory Visit: Payer: Self-pay

## 2020-03-24 ENCOUNTER — Ambulatory Visit (INDEPENDENT_AMBULATORY_CARE_PROVIDER_SITE_OTHER): Payer: Medicare HMO

## 2020-03-24 ENCOUNTER — Encounter (HOSPITAL_COMMUNITY): Payer: Self-pay | Admitting: *Deleted

## 2020-03-24 ENCOUNTER — Ambulatory Visit (HOSPITAL_COMMUNITY)
Admission: EM | Admit: 2020-03-24 | Discharge: 2020-03-24 | Disposition: A | Discharge status: Home / Self Care | Payer: Medicare HMO | Attending: Urgent Care | Admitting: Urgent Care

## 2020-03-24 DIAGNOSIS — S42415A Nondisplaced simple supracondylar fracture without intercondylar fracture of left humerus, initial encounter for closed fracture: Secondary | ICD-10-CM

## 2020-03-24 DIAGNOSIS — M79602 Pain in left arm: Secondary | ICD-10-CM | POA: Diagnosis not present

## 2020-03-24 DIAGNOSIS — W19XXXA Unspecified fall, initial encounter: Secondary | ICD-10-CM

## 2020-03-24 DIAGNOSIS — M25522 Pain in left elbow: Secondary | ICD-10-CM

## 2020-03-24 MED ORDER — HYDROCODONE-ACETAMINOPHEN 5-325 MG PO TABS
1.0000 | ORAL_TABLET | Freq: Once | ORAL | Status: AC
  Administered 2020-03-24: 1 via ORAL

## 2020-03-24 MED ORDER — HYDROCODONE-ACETAMINOPHEN 5-325 MG PO TABS
ORAL_TABLET | ORAL | Status: AC
  Filled 2020-03-24: qty 1

## 2020-03-24 MED ORDER — HYDROCODONE-ACETAMINOPHEN 5-325 MG PO TABS: 1.0000 | ORAL_TABLET | Freq: Four times a day (QID) | ORAL | 0 refills | Status: DC | PRN

## 2020-03-24 NOTE — ED Notes (Signed)
Patient fell last Saturday 10/16 in her kitchen.  No loc.  Patient reports left elbow did not start hurting until Friday 10/22.  Now left elbow is red, swollen, painful and patient has limited range of motion.  Elbow remains at 180 degree angle.  Left radial pulse 2 plus

## 2020-03-24 NOTE — Progress Notes (Signed)
Orthopedic Tech Progress Note Patient Details:  Terry Allison February 15, 1946 [id number]  Ortho Devices Type of Ortho Device: Post (long arm) splint, Sling immobilizer Ortho Device/Splint Location: Left Upper Extremity Ortho Device/Splint Interventions: Ordered, Application   Post Interventions Patient Tolerated: Well Instructions Provided: Adjustment of device, Care of device, Poper ambulation with device   [name] P Harle Stanford 03/24/2020, 6:25 PM

## 2020-03-24 NOTE — Discharge Instructions (Addendum)
Please make sure you contact your orthopedist, Dr. Eulah Pont.  In the meantime do not remove the left arm splint as this is meant to immobilize your left arm for your elbow fracture.  Please be careful using hydrocodone for your severe pain.  If you can manage her pain with just Tylenol did not do so.

## 2020-03-24 NOTE — ED Notes (Signed)
Ortho tech called 

## 2020-03-24 NOTE — ED Notes (Signed)
Splint applied by ortho tech

## 2020-03-24 NOTE — ED Notes (Signed)
Pt changed into a hospital gown so long arm splint cast may be applied to Lt arm

## 2020-03-24 NOTE — ED Provider Notes (Signed)
[place]   MRN: [id number] DOB: 1945-07-11  Subjective:   Terry Allison is a 74 y.o. female presenting for suffering an accidental fall in the kitchen ~1 week ago. Patient has been very unsteady since July 2021, had hip surgery from losing her balance. Had fracture of her hip from said fall. Has a hx of multiple rib fractures, rib fibular fracture. Sees Dr. Percell Miller regularly for her chronic joint pains. Has previously used opioid pain medication without issues and prefers this as she has been advised to avoid NSAIDs due to her kidneys.   No current facility-administered medications for this encounter.  Current Outpatient Medications:  .  alendronate (FOSAMAX) 70 MG tablet, Take 1 tablet (70 mg total) by mouth every 7 (seven) days. Take with a full glass of water on an empty stomach., Disp: 4 tablet, Rfl: 11 .  allopurinol (ZYLOPRIM) 100 MG tablet, Take 1 tablet (100 mg total) by mouth daily., Disp: 30 tablet, Rfl: 0 .  amLODipine (NORVASC) 5 MG tablet, Take 1 tablet (5 mg total) by mouth daily. No more refills without office visit, Disp: 90 tablet, Rfl: 0 .  aspirin EC 81 MG tablet, Take 81 mg by mouth daily., Disp: , Rfl:  .  cholecalciferol (VITAMIN D) 1000 units tablet, Take 1,000 Units by mouth daily., Disp: , Rfl:  .  LORazepam (ATIVAN) 1 MG tablet, Take 1 tablet (1 mg total) by mouth 3 (three) times daily as needed for anxiety., Disp: 10 tablet, Rfl: 0 .  mirtazapine (REMERON) 15 MG tablet, Take 15 mg by mouth at bedtime., Disp: , Rfl:  .  omeprazole (PRILOSEC) 20 MG capsule, Take 20 mg by mouth daily as needed (acid reflux). , Disp: , Rfl:  .  polyethylene glycol (MIRALAX / GLYCOLAX) 17 g packet, Take 17 g by mouth daily as needed for moderate constipation or severe constipation., Disp: 14 each, Rfl: 0 .  pregabalin (LYRICA) 75 MG capsule, Take 75 mg by mouth 2 (two) times daily., Disp: , Rfl:  .  Prenatal Vit-Fe Fumarate-FA (PRENATAL VITAMIN PLUS LOW IRON)  27-1 MG TABS, Take 1 tablet by mouth every evening., Disp: , Rfl: 0 .  senna-docusate (SENOKOT-S) 8.6-50 MG tablet, Take 1 tablet by mouth at bedtime as needed for mild constipation., Disp: 30 tablet, Rfl: 0 .  thiamine (VITAMIN B-1) 100 MG tablet, Take 100 mg by mouth daily., Disp: , Rfl:  .  triamcinolone ointment (KENALOG) 0.1 %, Apply 1 application topically as needed (skin irritation due to covid vaccine / psoriasis.). , Disp: , Rfl:  .  Albuterol Sulfate (PROAIR RESPICLICK) 427 (90 Base) MCG/ACT AEPB, Inhale 1 Inhaler into the lungs every 4 (four) hours as needed. (Patient taking differently: Inhale 2 Inhalers into the lungs every 4 (four) hours as needed (shortness of breath). ), Disp: 1 each, Rfl: 1 .  apixaban (ELIQUIS) 2.5 MG TABS tablet, Take 1 tablet (2.5 mg total) by mouth 2 (two) times daily for 28 days., Disp: 56 tablet, Rfl: 0 .  atorvastatin (LIPITOR) 40 MG tablet, TAKE 1 TABLET BY MOUTH EVERY DAY (Patient taking differently: Take 40 mg by mouth daily. ), Disp: 30 tablet, Rfl: 0 .  Blood Pressure Monitoring (BLOOD PRESSURE KIT) DEVI, 1 Units by Does not apply route once a week., Disp: 1 Device, Rfl: 0 .  calcipotriene (DOVONOX) 0.005 % cream, Apply 1 application topically in the morning. (Patient not taking: Reported on 12/25/2019), Disp: , Rfl:  .  fluticasone (FLONASE) 50  MCG/ACT nasal spray, Place 2 sprays into both nostrils 2 (two) times daily. (Patient taking differently: Place 2 sprays into both nostrils 2 (two) times daily as needed for rhinitis. ), Disp: 16 g, Rfl: 6 .  furosemide (LASIX) 20 MG tablet, Take 20 mg by mouth daily., Disp: , Rfl:  .  ipratropium (ATROVENT) 0.03 % nasal spray, PLACE 2 SPRAYS INTO THE NOSE 4 (FOUR) TIMES DAILY. (Patient taking differently: Place 2 sprays into the nose 4 (four) times daily as needed for rhinitis. ), Disp: 30 mL, Rfl: 0 .  levothyroxine (SYNTHROID, LEVOTHROID) 75 MCG tablet, TAKE 1 TABLET BY MOUTH EVERY DAY (Patient taking differently:  Take 75 mcg by mouth daily before breakfast. ), Disp: 30 tablet, Rfl: 0 .  metoprolol tartrate (LOPRESSOR) 50 MG tablet, TAKE 1 TABLET (50 MG TOTAL) BY MOUTH 2 (TWO) TIMES DAILY. (Patient taking differently: Take 25 mg by mouth 2 (two) times daily. ), Disp: 60 tablet, Rfl: 0   Allergies  Allergen Reactions  . Tramadol Nausea And Vomiting    Falling down     Past Medical History:  Diagnosis Date  . Allergy   . Asthma   . COPD (chronic obstructive pulmonary disease) ([place])   . High cholesterol   . Hypertension   . Neuromuscular disorder ([place])   . Thyroid disease      Past Surgical History:  Procedure Laterality Date  . FRACTURE SURGERY    . INTRAMEDULLARY (IM) NAIL INTERTROCHANTERIC Left 12/26/2019   Procedure: LEFT INTRAMEDULLARY  NAIL INTERTROCHANTRIC;  Surgeon: Renette Butters, MD;  Location: [place];  Service: Orthopedics;  Laterality: Left;  . LOWER EXTREMITY ANGIOGRAPHY N/A 03/29/2018   Procedure: LOWER EXTREMITY ANGIOGRAPHY;  Surgeon: Adrian Prows, MD;  Location: [place] [facility];  Service: Cardiovascular;  Laterality: N/A;  . ORIF TIBIA PLATEAU Right 03/05/2014   Procedure: OPEN REDUCTION INTERNAL FIXATION (ORIF) TIBIAL PLATEAU;  Surgeon: Renette Butters, MD;  Location: [place] [facility];  Service: Orthopedics;  Laterality: Right;  . SPINE SURGERY    . TONSILECTOMY/ADENOIDECTOMY WITH MYRINGOTOMY    . TUBAL LIGATION      Family History  Problem Relation Age of Onset  . Heart disease Father   . Mental illness Father   . Cancer Sister   . Hyperlipidemia Sister   . Diabetes Mother     Social History   Tobacco Use  . Smoking status: Former Smoker    Quit date: 07/22/1986    Years since quitting: 33.6  . Smokeless tobacco: Never Used  Vaping Use  . Vaping Use: Never used  Substance Use Topics  . Alcohol use: Yes    Alcohol/week: 4.0 standard drinks    Types: 4 Glasses of wine per week    Comment: daily 3-4 glasses of wine  . Drug use: No    ROS   Objective:    Vitals: BP 127/79 (BP Location: Right Arm)   Pulse 60   Temp 98 F (36.7 C) (Oral)   Resp 18   Ht 5' (1.524 m)   Wt 105 lb (47.6 kg)   SpO2 98%   BMI 20.51 kg/m   Physical Exam Constitutional:      General: She is not in acute distress.    Appearance: Normal appearance. She is well-developed. She is not ill-appearing, toxic-appearing or diaphoretic.  HENT:     Head: Normocephalic and atraumatic.     Nose: Nose normal.     Mouth/Throat:     Mouth: Mucous membranes are moist.  Pharynx: Oropharynx is clear.  Eyes:     General: No scleral icterus.       Right eye: No discharge.        Left eye: No discharge.     Extraocular Movements: Extraocular movements intact.     Conjunctiva/sclera: Conjunctivae normal.     Pupils: Pupils are equal, round, and reactive to light.  Cardiovascular:     Rate and Rhythm: Normal rate.  Pulmonary:     Effort: Pulmonary effort is normal.  Musculoskeletal:     Left elbow: Swelling and effusion present. No deformity or lacerations. Decreased range of motion. Tenderness present in radial head, medial epicondyle, lateral epicondyle and olecranon process.  Skin:    General: Skin is warm and dry.  Neurological:     General: No focal deficit present.     Mental Status: She is alert and oriented to person, place, and time.  Psychiatric:        Mood and Affect: Mood normal.        Behavior: Behavior normal.        Thought Content: Thought content normal.        Judgment: Judgment normal.     DG Elbow Complete Left  Result Date: 03/24/2020 CLINICAL DATA:  Elbow pain EXAM: LEFT ELBOW - COMPLETE 3+ VIEW COMPARISON:  None. FINDINGS: Lateral projection there is abnormal bony density along the posterior margin of the humerus above the elbow joint. There is a joint effusion anteriorly. AP view demonstrates a curvilinear lucency above the supracondylar notch suggestive supracondylar fracture. IMPRESSION: Supracondylar fracture of the distal humerus.  Electronically Signed   By: Suzy Bouchard M.D.   On: 03/24/2020 17:24    Assessment and Plan :   I have reviewed the PDMP during this encounter.  1. Left arm pain   2. Closed nondisplaced simple supracondylar fracture of left humerus without intercondylar fracture, initial encounter   3. Fall, initial encounter     Had extensive discussion with patient about increased risk of falls while using opioids. Patient verbalized understanding, will try to schedule APAP but since it has not helped will use hydrocodone to better control her pain and once improved will just use APAP. Patient is to be placed in posterior long arm splint. Follow up with Dr. Percell Miller asap. Counseled patient on potential for adverse effects with medications prescribed/recommended today, ER and return-to-clinic precautions discussed, patient verbalized understanding.    Jaynee Eagles, PA-C 03/25/20 0600

## 2020-03-24 NOTE — ED Triage Notes (Signed)
Pt fell in the kitchen while cooking dinner over a week ago. Pt had hip surgery in July 2021 and has been using a walker and a cane. Pt reports she lost her balance and fel on her tail bone .

## 2020-03-26 ENCOUNTER — Other Ambulatory Visit: Payer: Self-pay | Admitting: Orthopedic Surgery

## 2020-03-26 ENCOUNTER — Other Ambulatory Visit: Payer: Self-pay

## 2020-03-26 ENCOUNTER — Ambulatory Visit
Admission: RE | Admit: 2020-03-26 | Discharge: 2020-03-26 | Disposition: A | Discharge status: Home / Self Care | Payer: Medicare HMO | Source: Ambulatory Visit | Attending: Orthopedic Surgery | Admitting: Orthopedic Surgery

## 2020-03-26 DIAGNOSIS — M25522 Pain in left elbow: Secondary | ICD-10-CM

## 2020-11-18 ENCOUNTER — Other Ambulatory Visit: Payer: Self-pay | Admitting: Internal Medicine

## 2020-11-20 IMAGING — DX DG HIP (WITH OR WITHOUT PELVIS) 2-3V*L*
3 series · 3 of 3 positions shown · non-contrast
Comparison: None.

CLINICAL DATA: Fall, left hip pain

EXAM:
DG HIP (WITH OR WITHOUT PELVIS) 2-3V LEFT

[pelvis ap]
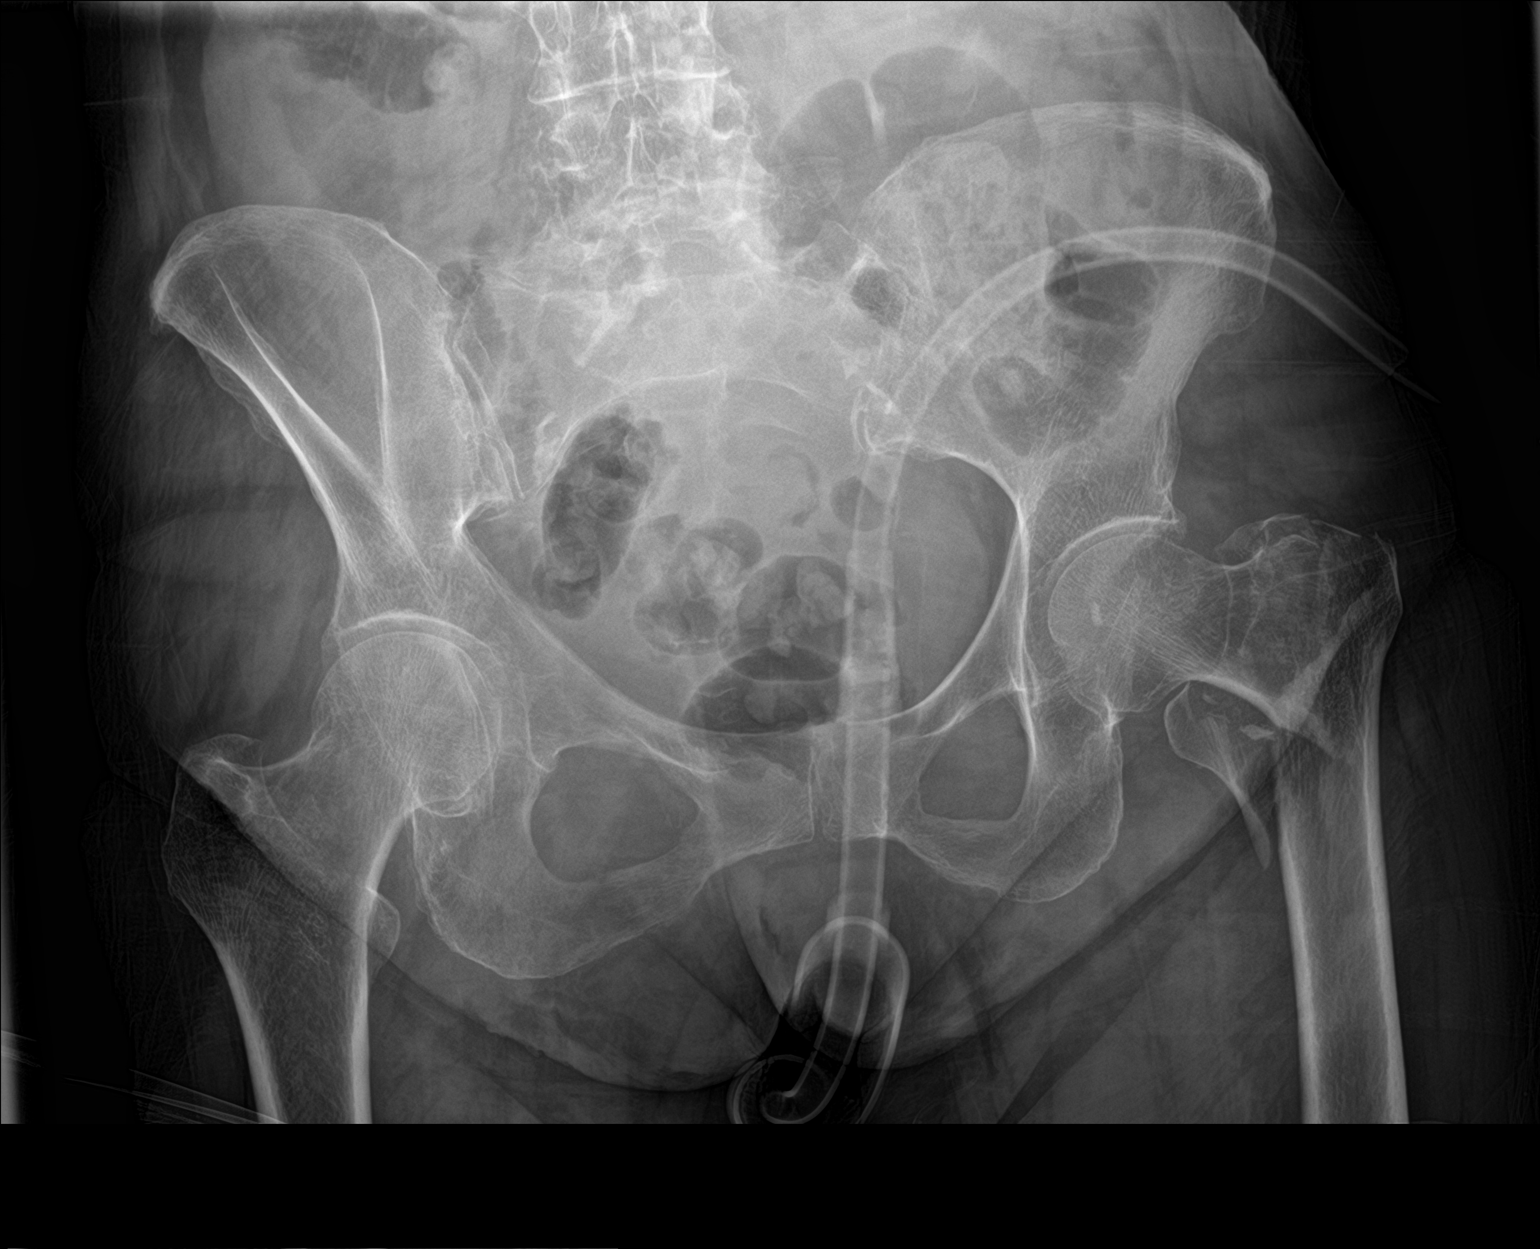

[hip ap]
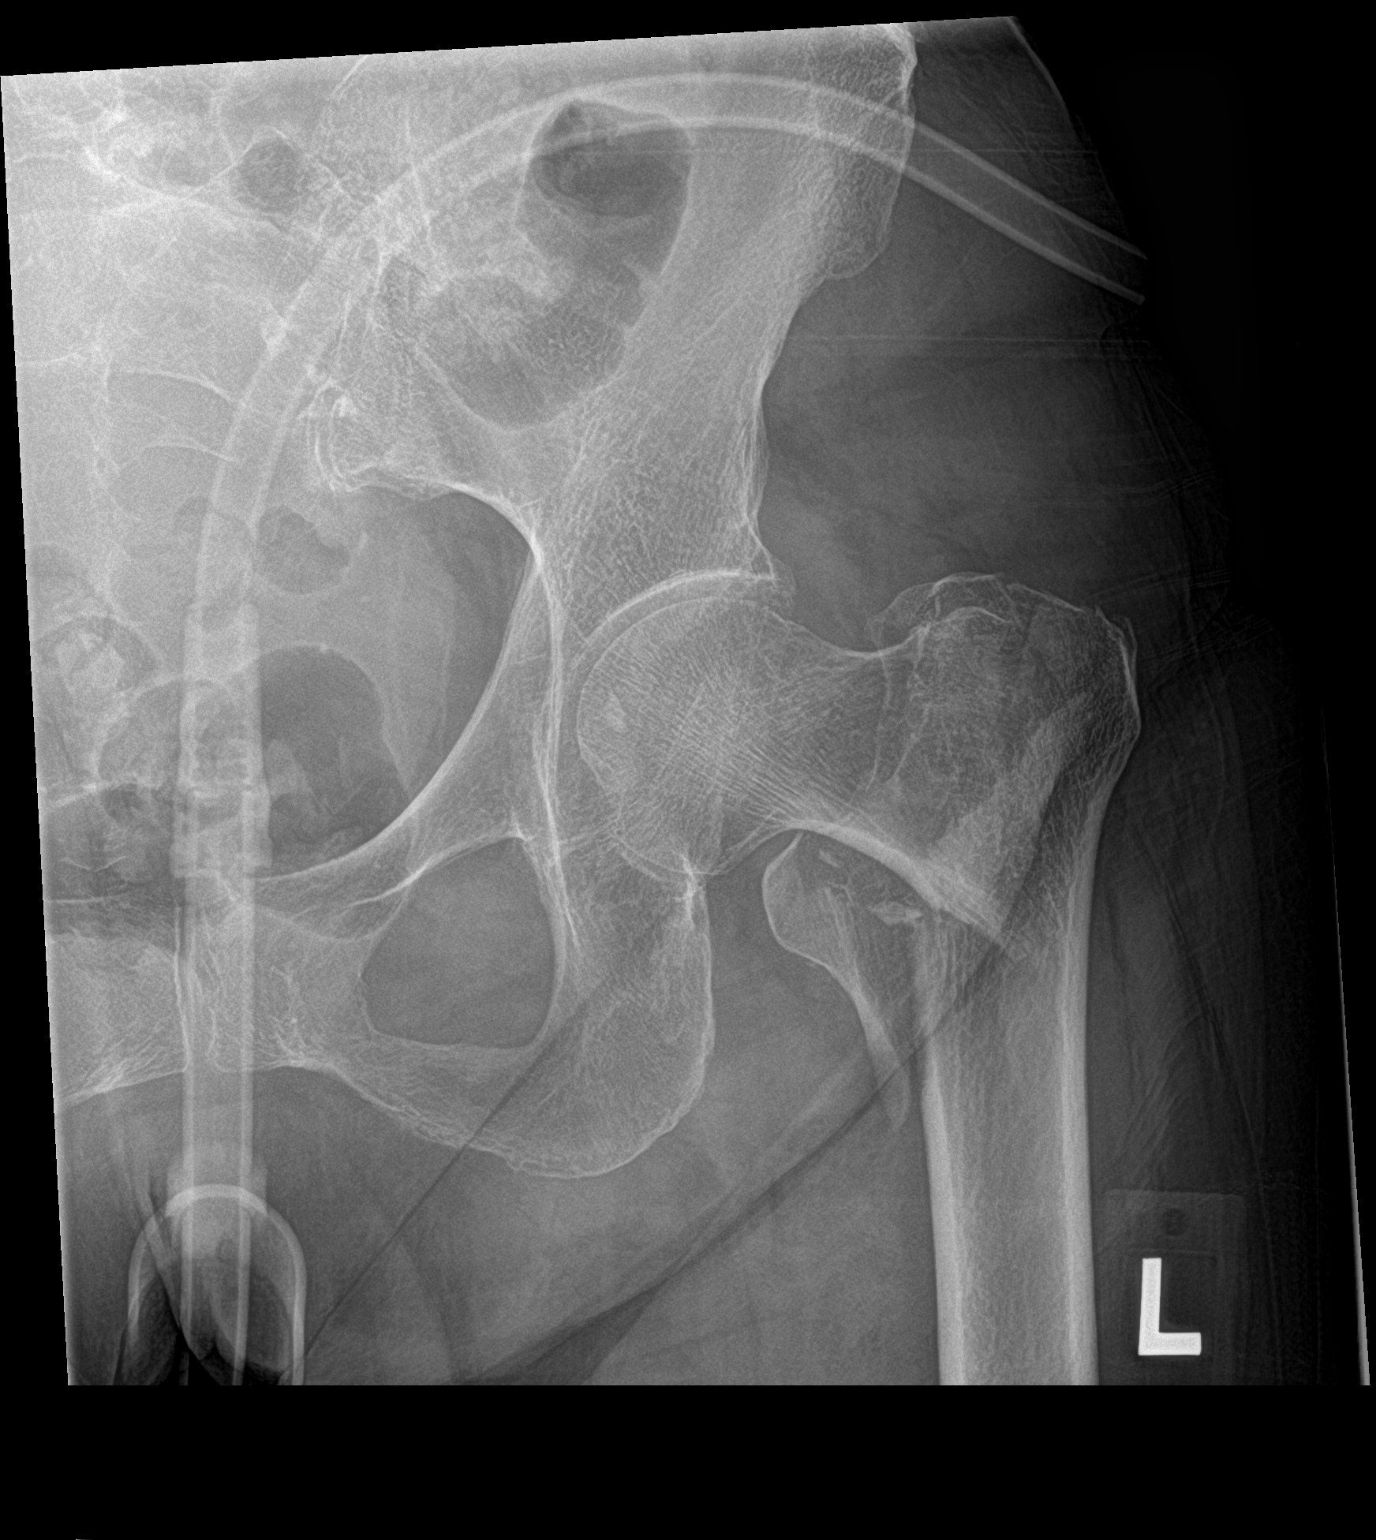

[hip x-table]
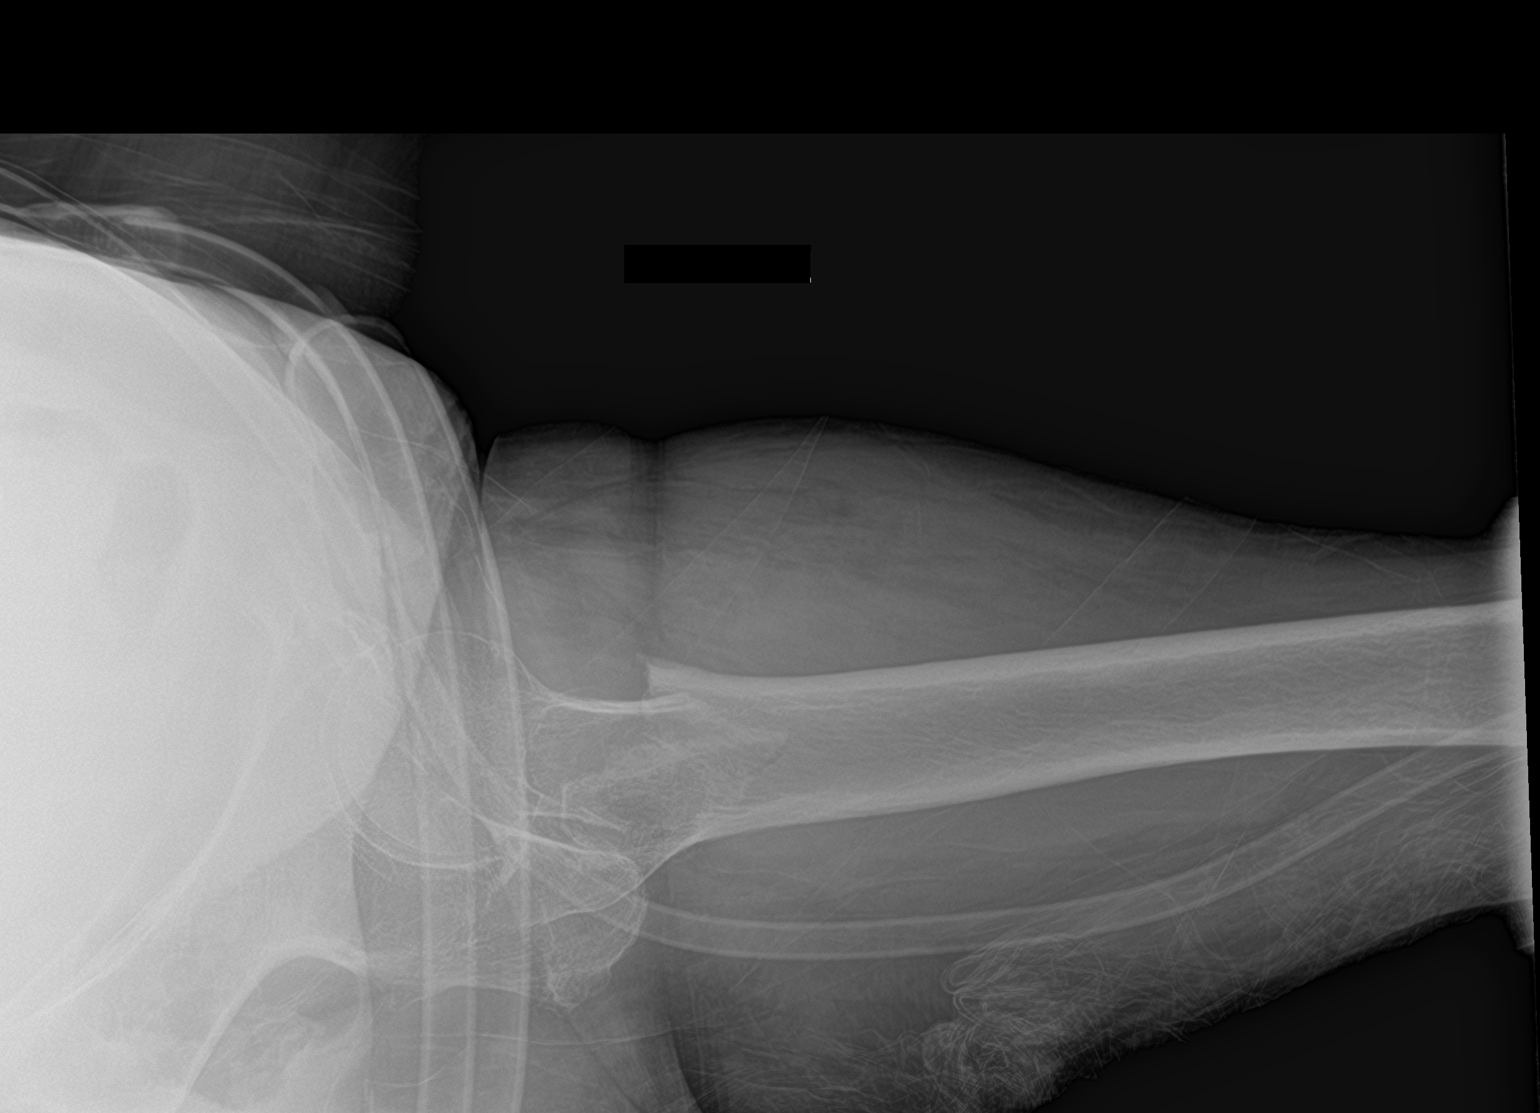

[3 of 3 positions shown; findings below may reference images not displayed]

FINDINGS: There is a left femoral intertrochanteric fracture with varus
angulation and displacement of fracture fragments. No subluxation or
dislocation. No additional acute bony abnormality.
IMPRESSION: Left femoral intertrochanteric fracture with varus angulation and
displacement.

## 2020-11-21 IMAGING — RF DG HIP (WITH PELVIS) OPERATIVE*L*
1 series · 5 of 5 positions shown · non-contrast
Comparison: Preoperative radiograph 12/25/2019

CLINICAL DATA: ORIF left hip fracture.

EXAM:
DG C-ARM 1-60 MIN; OPERATIVE LEFT HIP WITH PELVIS
FLUOROSCOPY TIME:  Fluoroscopy Time:  34 seconds
Radiation Exposure Index (if provided by the fluoroscopic device):
4.26 mGy
Number of Acquired Spot Images: 5

[Series 1: run · 5 of 5 slices shown]
[im 1/5]
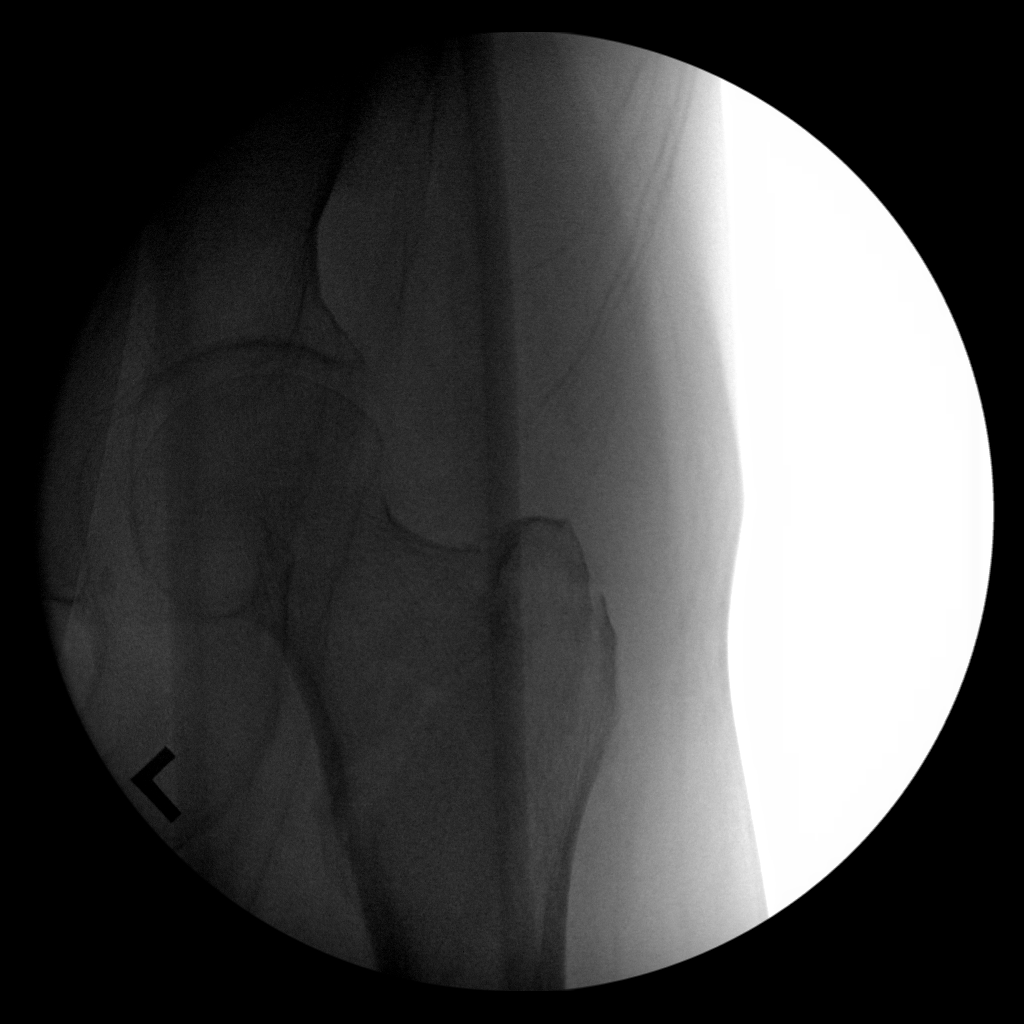
[im 2/5]
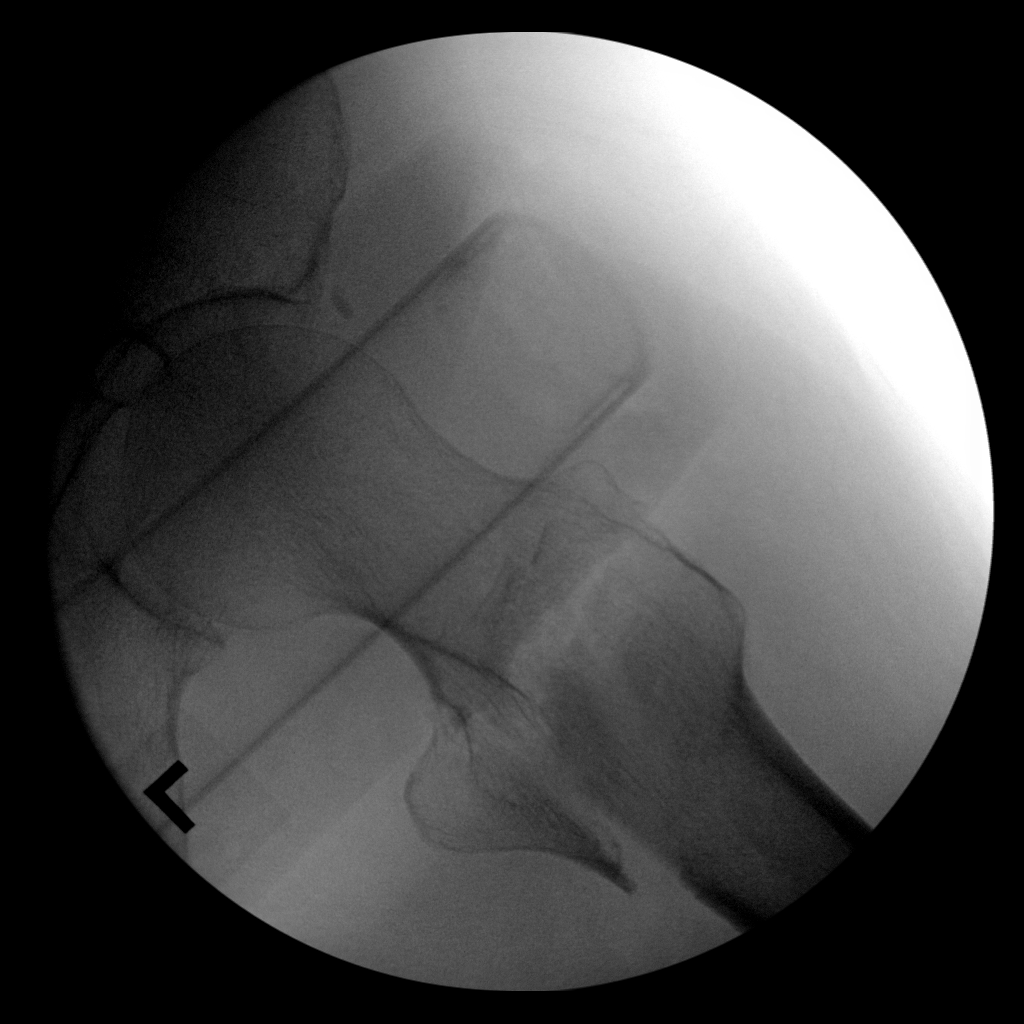
[im 3/5]
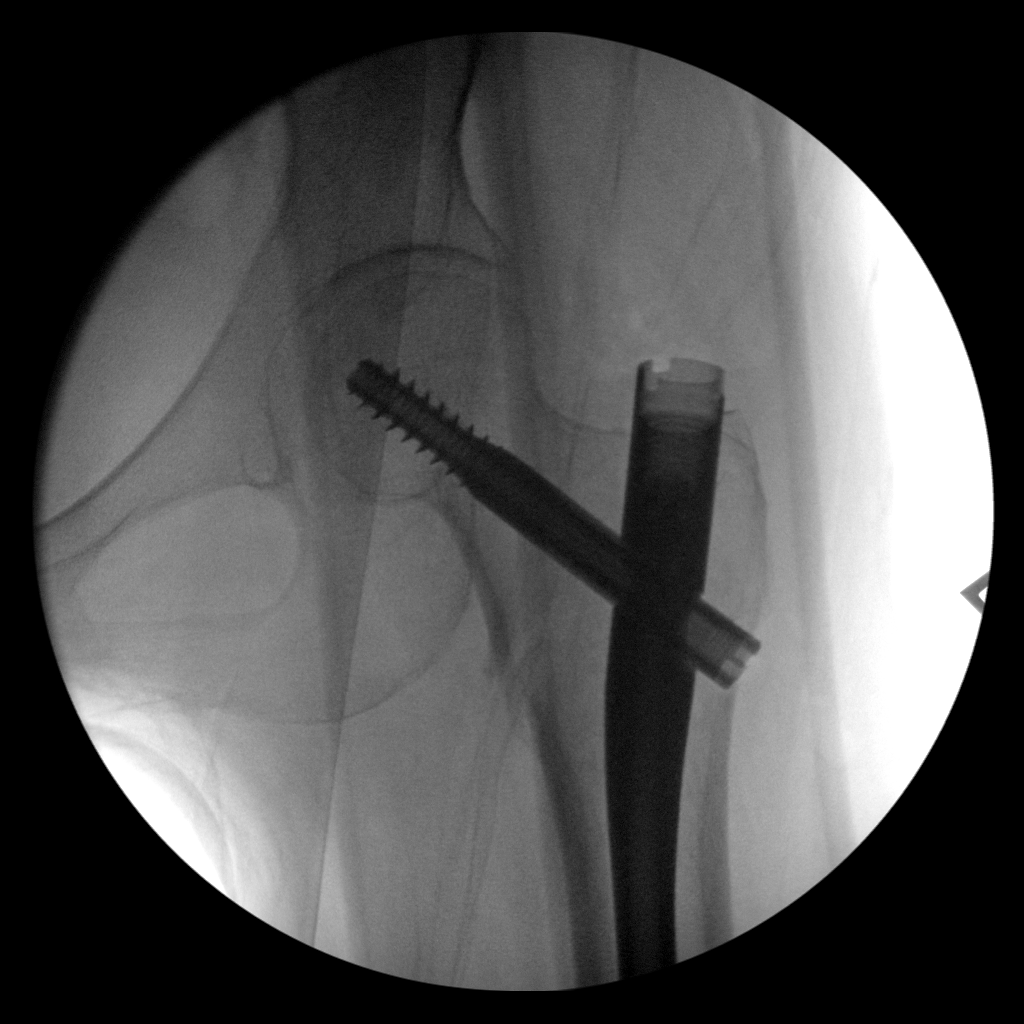
[im 4/5]
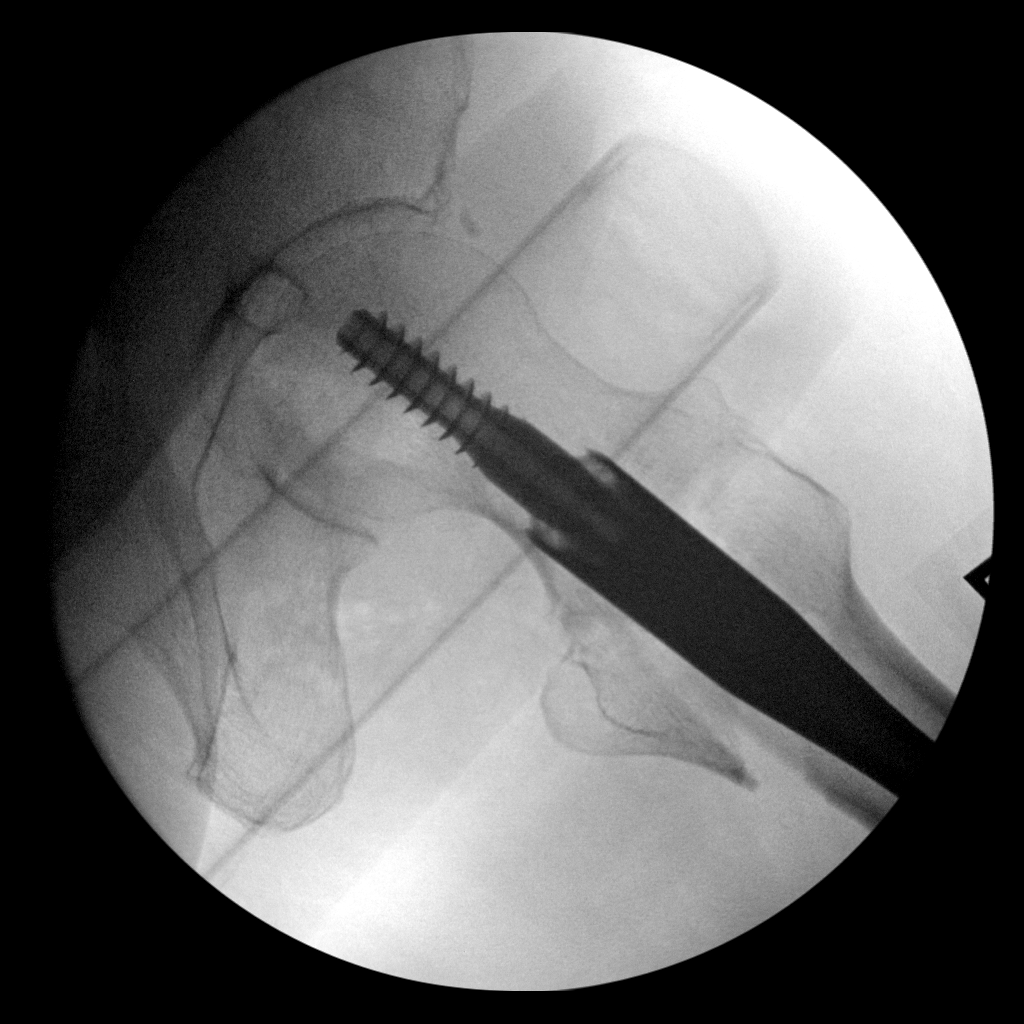
[im 5/5]
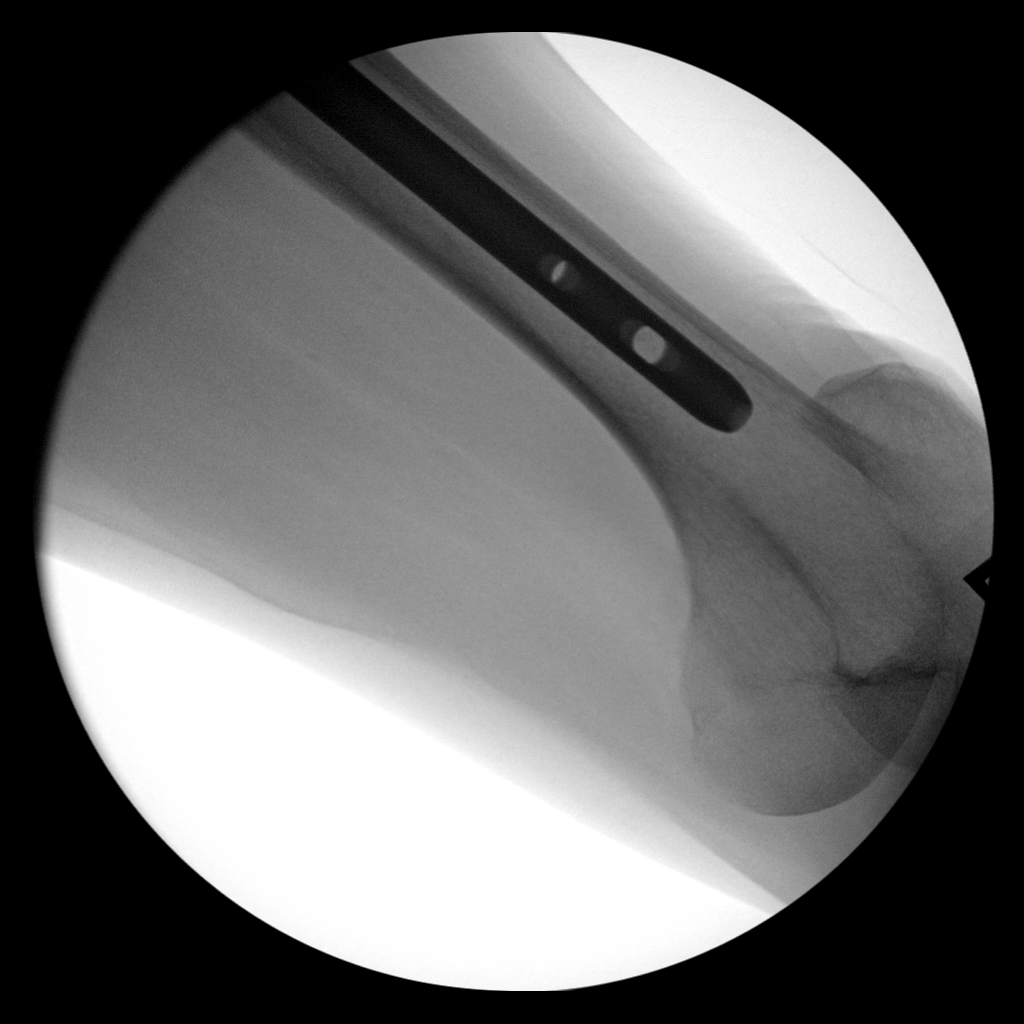

[5 of 5 positions shown; findings below may reference images not displayed]

FINDINGS: Five fluoroscopic spot views obtained in the operating room.
Intramedullary nail with trans trochanteric locking screws fixate
intertrochanteric femur fracture. The fracture is in improved
alignment after fixation. Total fluoroscopy time 34 seconds, total
dose 4.26 mGy.
IMPRESSION: Procedural fluoroscopy during ORIF left hip fracture.

## 2021-01-12 ENCOUNTER — Other Ambulatory Visit: Payer: Self-pay | Admitting: Internal Medicine

## 2021-09-29 ENCOUNTER — Other Ambulatory Visit: Payer: Self-pay | Admitting: Family Medicine

## 2021-09-29 DIAGNOSIS — Z8739 Personal history of other diseases of the musculoskeletal system and connective tissue: Secondary | ICD-10-CM

## 2021-09-30 ENCOUNTER — Other Ambulatory Visit (HOSPITAL_BASED_OUTPATIENT_CLINIC_OR_DEPARTMENT_OTHER): Payer: Self-pay | Admitting: Family Medicine

## 2021-09-30 DIAGNOSIS — N1832 Chronic kidney disease, stage 3b: Secondary | ICD-10-CM

## 2021-10-09 ENCOUNTER — Ambulatory Visit (HOSPITAL_BASED_OUTPATIENT_CLINIC_OR_DEPARTMENT_OTHER): Payer: Medicare Other

## 2021-10-09 DIAGNOSIS — N1832 Chronic kidney disease, stage 3b: Secondary | ICD-10-CM

## 2021-10-23 ENCOUNTER — Ambulatory Visit
Admission: RE | Admit: 2021-10-23 | Discharge: 2021-10-23 | Disposition: A | Discharge status: Home / Self Care | Payer: Medicare Other | Source: Ambulatory Visit | Attending: Family Medicine | Admitting: Family Medicine

## 2021-10-23 DIAGNOSIS — Z8739 Personal history of other diseases of the musculoskeletal system and connective tissue: Secondary | ICD-10-CM

## 2022-02-03 ENCOUNTER — Other Ambulatory Visit: Payer: Self-pay

## 2022-02-03 ENCOUNTER — Encounter (HOSPITAL_COMMUNITY): Payer: Self-pay | Admitting: Emergency Medicine

## 2022-02-03 ENCOUNTER — Emergency Department (HOSPITAL_COMMUNITY)
Admission: EM | Admit: 2022-02-03 | Discharge: 2022-02-03 | Disposition: A | Discharge status: Home / Self Care | Payer: Medicare Other | Attending: Emergency Medicine | Admitting: Emergency Medicine

## 2022-02-03 ENCOUNTER — Emergency Department (HOSPITAL_COMMUNITY): Payer: Medicare Other

## 2022-02-03 DIAGNOSIS — Z79899 Other long term (current) drug therapy: Secondary | ICD-10-CM | POA: Insufficient documentation

## 2022-02-03 DIAGNOSIS — W01198A Fall on same level from slipping, tripping and stumbling with subsequent striking against other object, initial encounter: Secondary | ICD-10-CM | POA: Diagnosis not present

## 2022-02-03 DIAGNOSIS — J45909 Unspecified asthma, uncomplicated: Secondary | ICD-10-CM | POA: Insufficient documentation

## 2022-02-03 DIAGNOSIS — I10 Essential (primary) hypertension: Secondary | ICD-10-CM | POA: Insufficient documentation

## 2022-02-03 DIAGNOSIS — S0181XA Laceration without foreign body of other part of head, initial encounter: Secondary | ICD-10-CM | POA: Diagnosis not present

## 2022-02-03 DIAGNOSIS — J449 Chronic obstructive pulmonary disease, unspecified: Secondary | ICD-10-CM | POA: Insufficient documentation

## 2022-02-03 DIAGNOSIS — Z7901 Long term (current) use of anticoagulants: Secondary | ICD-10-CM | POA: Insufficient documentation

## 2022-02-03 DIAGNOSIS — Z7982 Long term (current) use of aspirin: Secondary | ICD-10-CM | POA: Insufficient documentation

## 2022-02-03 DIAGNOSIS — S0990XA Unspecified injury of head, initial encounter: Secondary | ICD-10-CM | POA: Diagnosis present

## 2022-02-03 MED ORDER — LIDOCAINE-EPINEPHRINE (PF) 2 %-1:200000 IJ SOLN
20.0000 mL | Freq: Once | INTRAMUSCULAR | Status: AC
  Administered 2022-02-03: 20 mL
  Filled 2022-02-03: qty 20

## 2022-02-03 MED ORDER — ACETAMINOPHEN 325 MG PO TABS
650.0000 mg | ORAL_TABLET | Freq: Once | ORAL | Status: AC
  Administered 2022-02-03: 650 mg via ORAL
  Filled 2022-02-03: qty 2

## 2022-02-03 NOTE — Discharge Instructions (Signed)
You were seen today for falling and hitting her head.  You have a significant laceration to your face.  This was repaired.  You need to have suture removal in 1 week.  Make sure that you are applying antibiotic ointment.

## 2022-02-03 NOTE — ED Provider Notes (Signed)
[facility] EMERGENCY DEPARTMENT Provider Note   CSN: [id number] Arrival date & time: 02/03/22  0059     History  Chief Complaint  Patient presents with   Head Injury    Skin Laceration     CHRISTNA KULICK is a 76 y.o. female.  HPI     This is a 76 year old female who presents with a head laceration.  Patient reports that she tripped and fell on her office.  She hit her head on her filing cabinet.  She did not lose consciousness.  She is not on any blood thinners.  Besides slight headache, she denies any neck pain or long bone pain.  She has been ambulatory since.  Denies any vision changes.  Home Medications Prior to Admission medications   Medication Sig Start Date End Date Taking? Authorizing Provider  Albuterol Sulfate (PROAIR RESPICLICK) 660 (90 Base) MCG/ACT AEPB Inhale 1 Inhaler into the lungs every 4 (four) hours as needed. Patient taking differently: Inhale 2 Inhalers into the lungs every 4 (four) hours as needed (shortness of breath).  04/03/17   Tenna Delaine D, PA-C  allopurinol (ZYLOPRIM) 100 MG tablet Take 1 tablet (100 mg total) by mouth daily. 01/01/20   Bloomfield, Carley D, DO  amLODipine (NORVASC) 5 MG tablet Take 1 tablet (5 mg total) by mouth daily. No more refills without office visit 10/04/17   Tenna Delaine D, PA-C  apixaban (ELIQUIS) 2.5 MG TABS tablet Take 1 tablet (2.5 mg total) by mouth 2 (two) times daily for 28 days. 12/28/19 01/25/20  Gaylan Gerold, DO  aspirin EC 81 MG tablet Take 81 mg by mouth daily.    [provider]  atorvastatin (LIPITOR) 40 MG tablet TAKE 1 TABLET BY MOUTH EVERY DAY Patient taking differently: Take 40 mg by mouth daily.  04/05/18   Tenna Delaine D, PA-C  Blood Pressure Monitoring (BLOOD PRESSURE KIT) DEVI 1 Units by Does not apply route once a week. 04/03/17   Tenna Delaine D, PA-C  calcipotriene (DOVONOX) 0.005 % cream Apply 1 application topically in the morning. Patient not taking:  Reported on 12/25/2019 11/10/19   [provider]  cholecalciferol (VITAMIN D) 1000 units tablet Take 1,000 Units by mouth daily.    [provider]  fluticasone (FLONASE) 50 MCG/ACT nasal spray Place 2 sprays into both nostrils 2 (two) times daily. Patient taking differently: Place 2 sprays into both nostrils 2 (two) times daily as needed for rhinitis.  04/03/17   Tenna Delaine D, PA-C  furosemide (LASIX) 20 MG tablet Take 20 mg by mouth daily. 03/11/20   [provider]  HYDROcodone-acetaminophen (NORCO/VICODIN) 5-325 MG tablet Take 1 tablet by mouth every 6 (six) hours as needed for severe pain. 03/24/20   Jaynee Eagles, PA-C  ipratropium (ATROVENT) 0.03 % nasal spray PLACE 2 SPRAYS INTO THE NOSE 4 (FOUR) TIMES DAILY. Patient taking differently: Place 2 sprays into the nose 4 (four) times daily as needed for rhinitis.  02/24/18   Tenna Delaine D, PA-C  levothyroxine (SYNTHROID, LEVOTHROID) 75 MCG tablet TAKE 1 TABLET BY MOUTH EVERY DAY Patient taking differently: Take 75 mcg by mouth daily before breakfast.  03/21/18   Tenna Delaine D, PA-C  LORazepam (ATIVAN) 1 MG tablet Take 1 tablet (1 mg total) by mouth 3 (three) times daily as needed for anxiety. 05/27/17   Isla Pence, MD  metoprolol tartrate (LOPRESSOR) 50 MG tablet TAKE 1 TABLET (50 MG TOTAL) BY MOUTH 2 (TWO) TIMES DAILY. Patient taking differently:  Take 25 mg by mouth 2 (two) times daily.  05/24/18   Daleen Squibb, MD  mirtazapine (REMERON) 15 MG tablet Take 15 mg by mouth at bedtime. 11/08/19   [provider]  omeprazole (PRILOSEC) 20 MG capsule Take 20 mg by mouth daily as needed (acid reflux).     [provider]  polyethylene glycol (MIRALAX / GLYCOLAX) 17 g packet Take 17 g by mouth daily as needed for moderate constipation or severe constipation. 12/31/19   Bloomfield, Carley D, DO  pregabalin (LYRICA) 75 MG capsule Take 75 mg by mouth 2 (two) times daily.    [provider]  Prenatal Vit-Fe Fumarate-FA (PRENATAL VITAMIN PLUS LOW IRON) 27-1 MG TABS Take 1 tablet by mouth every evening. 02/02/18   [provider]  senna-docusate (SENOKOT-S) 8.6-50 MG tablet Take 1 tablet by mouth at bedtime as needed for mild constipation. 12/31/19   Bloomfield, Carley D, DO  thiamine (VITAMIN B-1) 100 MG tablet Take 100 mg by mouth daily.    [provider]  triamcinolone ointment (KENALOG) 0.1 % Apply 1 application topically as needed (skin irritation due to covid vaccine / psoriasis.).  11/10/19   [provider]      Allergies    Tramadol    Review of Systems   Review of Systems  Skin:  Positive for wound.  All other systems reviewed and are negative.   Physical Exam Updated Vital Signs BP 119/86   Pulse (!) 44   Temp 97.6 F (36.4 C) (Oral)   Resp 17   SpO2 98%  Physical Exam Vitals and nursing note reviewed.  Constitutional:      Appearance: She is well-developed. She is not ill-appearing.  HENT:     Head: Normocephalic.     Comments: 8 cm flap-like laceration over the right temporal region, no bleeding noted Eyes:     Extraocular Movements: Extraocular movements intact.     Pupils: Pupils are equal, round, and reactive to light.     Comments: Pupils 3 and reactive bilaterally  Cardiovascular:     Rate and Rhythm: Normal rate and regular rhythm.     Heart sounds: Normal heart sounds.  Pulmonary:     Effort: Pulmonary effort is normal. No respiratory distress.     Breath sounds: No wheezing.  Abdominal:     Palpations: Abdomen is soft.  Musculoskeletal:        General: No deformity.     Cervical back: Neck supple.  Skin:    General: Skin is warm and dry.  Neurological:     Mental Status: She is alert and oriented to person, place, and time.     Comments: Cranial nerves II through XII intact, 5 out of 5 strength in all 4 extremities  Psychiatric:        Mood and Affect: Mood normal.     ED Results /  Procedures / Treatments   Labs (all labs ordered are listed, but only abnormal results are displayed) Labs Reviewed - No data to display  EKG None  Radiology CT Head Wo Contrast  Result Date: 02/03/2022 CLINICAL DATA:  Head trauma, minor (Age >= 65y); Neck trauma (Age >= 65y); Facial trauma, blunt. Fall. EXAM: CT HEAD WITHOUT CONTRAST CT MAXILLOFACIAL WITHOUT CONTRAST CT CERVICAL SPINE WITHOUT CONTRAST TECHNIQUE: Multidetector CT imaging of the head, cervical spine, and maxillofacial structures were performed using the standard protocol without intravenous contrast. Multiplanar CT image reconstructions of the cervical spine and maxillofacial  structures were also generated. RADIATION DOSE REDUCTION: This exam was performed according to the departmental dose-optimization program which includes automated exposure control, adjustment of the mA and/or kV according to patient size and/or use of iterative reconstruction technique. COMPARISON:  None Available. FINDINGS: CT HEAD FINDINGS Brain: Normal anatomic configuration. Parenchymal volume loss is commensurate with the patient's age. Mild periventricular white matter changes are present likely reflecting the sequela of small vessel ischemia. No abnormal intra or extra-axial mass lesion or fluid collection. No abnormal mass effect or midline shift. No evidence of acute intracranial hemorrhage or infarct. Ventricular size is normal. Cerebellum unremarkable. Vascular: No asymmetric hyperdense vasculature at the skull base. Skull: Intact Other: Mastoid air cells and middle ear cavities are clear. Laceration noted superficial to the right supraorbital ridge in frontal bone. Small subcutaneous gas noted within the right temporal fossa. CT MAXILLOFACIAL FINDINGS Osseous: Remote right nasal bone fracture. No acute facial fracture. No mandibular dislocation. Orbits: Mild right preseptal soft tissue swelling. The ocular globes are intact. The ocular lenses are ortho  topically position. Retro-orbital fat is preserved. Extraocular musculature and optic nerves are unremarkable. Sinuses: Moderate mucosal thickening within the left sphenoid sinus with a small amount of layering fluid. Remaining paranasal sinuses are clear. Soft tissues: Mild soft tissue swelling involving the right temporal fossa. CT CERVICAL SPINE FINDINGS Alignment: Anterior cervical discectomy infusion of C5-C7 with solid incorporation of interbody bone graft. Mild straightening along the fused segment. 2 mm anterolisthesis C2-3, C3-4, and C4-5 is likely degenerative in nature. Skull base and vertebrae: Craniocervical alignment is normal. The atlantodental interval is not widened. No acute fracture of the cervical spine. Vertebral body height is preserved. Soft tissues and spinal canal: No prevertebral fluid or swelling. No visible canal hematoma. Moderate bilateral carotid bifurcation calcifications. Disc levels: There is severe intervertebral disc space narrowing and endplate remodeling at I1-C3 in keeping with changes of advanced degenerative disc disease. Milder degenerative changes are noted throughout the remainder of the cervical spine and at T1-2. Prevertebral soft tissues are not thickened on sagittal reformats. The spinal canal is widely patent. Multilevel uncovertebral and facet arthrosis results in multilevel moderate to severe neuroforaminal narrowing, severe on the right at C2-3, bilaterally at C3-4, and on the right at C4-5. Upper chest: Negative. Other: None IMPRESSION: 1. No acute intracranial abnormality. No calvarial fracture. 2. Scalp laceration superficial to the right supraorbital ridge and right frontal bone. Associated soft tissue swelling and subcutaneous gas within the right temporal fossa and right preseptal soft tissue swelling. 3. No acute facial fracture. 4. No acute fracture or listhesis of the cervical spine. 5. Anterior cervical discectomy infusion of C5-C7 with solid  incorporation of interbody bone graft. 6. Multilevel degenerative disc and degenerative joint disease resulting in multilevel neuroforaminal narrowing as described above. Electronically Signed   By: Fidela Salisbury M.D.   On: 02/03/2022 02:39   CT Cervical Spine Wo Contrast  Result Date: 02/03/2022 CLINICAL DATA:  Head trauma, minor (Age >= 65y); Neck trauma (Age >= 65y); Facial trauma, blunt. Fall. EXAM: CT HEAD WITHOUT CONTRAST CT MAXILLOFACIAL WITHOUT CONTRAST CT CERVICAL SPINE WITHOUT CONTRAST TECHNIQUE: Multidetector CT imaging of the head, cervical spine, and maxillofacial structures were performed using the standard protocol without intravenous contrast. Multiplanar CT image reconstructions of the cervical spine and maxillofacial structures were also generated. RADIATION DOSE REDUCTION: This exam was performed according to the departmental dose-optimization program which includes automated exposure control, adjustment of the mA and/or kV according to patient size and/or use  of iterative reconstruction technique. COMPARISON:  None Available. FINDINGS: CT HEAD FINDINGS Brain: Normal anatomic configuration. Parenchymal volume loss is commensurate with the patient's age. Mild periventricular white matter changes are present likely reflecting the sequela of small vessel ischemia. No abnormal intra or extra-axial mass lesion or fluid collection. No abnormal mass effect or midline shift. No evidence of acute intracranial hemorrhage or infarct. Ventricular size is normal. Cerebellum unremarkable. Vascular: No asymmetric hyperdense vasculature at the skull base. Skull: Intact Other: Mastoid air cells and middle ear cavities are clear. Laceration noted superficial to the right supraorbital ridge in frontal bone. Small subcutaneous gas noted within the right temporal fossa. CT MAXILLOFACIAL FINDINGS Osseous: Remote right nasal bone fracture. No acute facial fracture. No mandibular dislocation. Orbits: Mild right  preseptal soft tissue swelling. The ocular globes are intact. The ocular lenses are ortho topically position. Retro-orbital fat is preserved. Extraocular musculature and optic nerves are unremarkable. Sinuses: Moderate mucosal thickening within the left sphenoid sinus with a small amount of layering fluid. Remaining paranasal sinuses are clear. Soft tissues: Mild soft tissue swelling involving the right temporal fossa. CT CERVICAL SPINE FINDINGS Alignment: Anterior cervical discectomy infusion of C5-C7 with solid incorporation of interbody bone graft. Mild straightening along the fused segment. 2 mm anterolisthesis C2-3, C3-4, and C4-5 is likely degenerative in nature. Skull base and vertebrae: Craniocervical alignment is normal. The atlantodental interval is not widened. No acute fracture of the cervical spine. Vertebral body height is preserved. Soft tissues and spinal canal: No prevertebral fluid or swelling. No visible canal hematoma. Moderate bilateral carotid bifurcation calcifications. Disc levels: There is severe intervertebral disc space narrowing and endplate remodeling at K4-Y1 in keeping with changes of advanced degenerative disc disease. Milder degenerative changes are noted throughout the remainder of the cervical spine and at T1-2. Prevertebral soft tissues are not thickened on sagittal reformats. The spinal canal is widely patent. Multilevel uncovertebral and facet arthrosis results in multilevel moderate to severe neuroforaminal narrowing, severe on the right at C2-3, bilaterally at C3-4, and on the right at C4-5. Upper chest: Negative. Other: None IMPRESSION: 1. No acute intracranial abnormality. No calvarial fracture. 2. Scalp laceration superficial to the right supraorbital ridge and right frontal bone. Associated soft tissue swelling and subcutaneous gas within the right temporal fossa and right preseptal soft tissue swelling. 3. No acute facial fracture. 4. No acute fracture or listhesis of the  cervical spine. 5. Anterior cervical discectomy infusion of C5-C7 with solid incorporation of interbody bone graft. 6. Multilevel degenerative disc and degenerative joint disease resulting in multilevel neuroforaminal narrowing as described above. Electronically Signed   By: Fidela Salisbury M.D.   On: 02/03/2022 02:39   CT Maxillofacial Wo Contrast  Result Date: 02/03/2022 CLINICAL DATA:  Head trauma, minor (Age >= 65y); Neck trauma (Age >= 65y); Facial trauma, blunt. Fall. EXAM: CT HEAD WITHOUT CONTRAST CT MAXILLOFACIAL WITHOUT CONTRAST CT CERVICAL SPINE WITHOUT CONTRAST TECHNIQUE: Multidetector CT imaging of the head, cervical spine, and maxillofacial structures were performed using the standard protocol without intravenous contrast. Multiplanar CT image reconstructions of the cervical spine and maxillofacial structures were also generated. RADIATION DOSE REDUCTION: This exam was performed according to the departmental dose-optimization program which includes automated exposure control, adjustment of the mA and/or kV according to patient size and/or use of iterative reconstruction technique. COMPARISON:  None Available. FINDINGS: CT HEAD FINDINGS Brain: Normal anatomic configuration. Parenchymal volume loss is commensurate with the patient's age. Mild periventricular white matter changes are present likely reflecting the  sequela of small vessel ischemia. No abnormal intra or extra-axial mass lesion or fluid collection. No abnormal mass effect or midline shift. No evidence of acute intracranial hemorrhage or infarct. Ventricular size is normal. Cerebellum unremarkable. Vascular: No asymmetric hyperdense vasculature at the skull base. Skull: Intact Other: Mastoid air cells and middle ear cavities are clear. Laceration noted superficial to the right supraorbital ridge in frontal bone. Small subcutaneous gas noted within the right temporal fossa. CT MAXILLOFACIAL FINDINGS Osseous: Remote right nasal bone fracture.  No acute facial fracture. No mandibular dislocation. Orbits: Mild right preseptal soft tissue swelling. The ocular globes are intact. The ocular lenses are ortho topically position. Retro-orbital fat is preserved. Extraocular musculature and optic nerves are unremarkable. Sinuses: Moderate mucosal thickening within the left sphenoid sinus with a small amount of layering fluid. Remaining paranasal sinuses are clear. Soft tissues: Mild soft tissue swelling involving the right temporal fossa. CT CERVICAL SPINE FINDINGS Alignment: Anterior cervical discectomy infusion of C5-C7 with solid incorporation of interbody bone graft. Mild straightening along the fused segment. 2 mm anterolisthesis C2-3, C3-4, and C4-5 is likely degenerative in nature. Skull base and vertebrae: Craniocervical alignment is normal. The atlantodental interval is not widened. No acute fracture of the cervical spine. Vertebral body height is preserved. Soft tissues and spinal canal: No prevertebral fluid or swelling. No visible canal hematoma. Moderate bilateral carotid bifurcation calcifications. Disc levels: There is severe intervertebral disc space narrowing and endplate remodeling at X5-T7 in keeping with changes of advanced degenerative disc disease. Milder degenerative changes are noted throughout the remainder of the cervical spine and at T1-2. Prevertebral soft tissues are not thickened on sagittal reformats. The spinal canal is widely patent. Multilevel uncovertebral and facet arthrosis results in multilevel moderate to severe neuroforaminal narrowing, severe on the right at C2-3, bilaterally at C3-4, and on the right at C4-5. Upper chest: Negative. Other: None IMPRESSION: 1. No acute intracranial abnormality. No calvarial fracture. 2. Scalp laceration superficial to the right supraorbital ridge and right frontal bone. Associated soft tissue swelling and subcutaneous gas within the right temporal fossa and right preseptal soft tissue  swelling. 3. No acute facial fracture. 4. No acute fracture or listhesis of the cervical spine. 5. Anterior cervical discectomy infusion of C5-C7 with solid incorporation of interbody bone graft. 6. Multilevel degenerative disc and degenerative joint disease resulting in multilevel neuroforaminal narrowing as described above. Electronically Signed   By: Fidela Salisbury M.D.   On: 02/03/2022 02:39    Procedures Procedures    Medications Ordered in ED Medications  acetaminophen (TYLENOL) tablet 650 mg (650 mg Oral Given 02/03/22 0151)  lidocaine-EPINEPHrine (XYLOCAINE W/EPI) 2 %-1:200000 (PF) injection 20 mL (20 mLs Infiltration Given 02/03/22 0335)    ED Course/ Medical Decision Making/ A&P                           Medical Decision Making Risk OTC drugs. Prescription drug management.   This patient presents to the ED for concern of head injury, laceration, this involves an extensive number of treatment options, and is a complaint that carries with it a high risk of complications and morbidity.  I considered the following differential and admission for this acute, potentially life threatening condition.  The differential diagnosis includes intracranial bleed, laceration, facial fracture  MDM:    This is a 76 year old female who presents following a mechanical fall.  She has significant laceration to the right temple.  She is overall nontoxic.  ABCs intact.  She is able to provide history and describes a mechanical fall.  CT head, neck, and face were obtained.  Imaging is unremarkable for acute traumatic head bleed or fractures.  Laceration was repaired at the bedside by the PA.  See additional note for details.  On recheck, patient remains clinically stable.  Laceration well approximated.  She was given wound care instructions and return precautions.  Return for suture removal in approximately 1 week.  (Labs, imaging, consults)  Labs: I Ordered, and personally interpreted labs.  The pertinent  results include: None  Imaging Studies ordered: I ordered imaging studies including CT head, face, neck I independently visualized and interpreted imaging. I agree with the radiologist interpretation  Additional history obtained from family at bedside.  External records from outside source obtained and reviewed including prior evaluations  Cardiac Monitoring: The patient was maintained on a cardiac monitor.  I personally viewed and interpreted the cardiac monitored which showed an underlying rhythm of: Sinus rhythm  Reevaluation: After the interventions noted above, I reevaluated the patient and found that they have :improved  Social Determinants of Health: Lives independently  Disposition: Discharge  Co morbidities that complicate the patient evaluation  Past Medical History:  Diagnosis Date   Allergy    Asthma    COPD (chronic obstructive pulmonary disease) ([place])    High cholesterol    Hypertension    Neuromuscular disorder (HCC)    Thyroid disease      Medicines Meds ordered this encounter  Medications   acetaminophen (TYLENOL) tablet 650 mg   lidocaine-EPINEPHrine (XYLOCAINE W/EPI) 2 %-1:200000 (PF) injection 20 mL    I have reviewed the patients home medicines and have made adjustments as needed  Problem List / ED Course: Problem List Items Addressed This Visit   None Visit Diagnoses     Facial laceration, initial encounter    -  Primary   Minor head injury, initial encounter                       Final Clinical Impression(s) / ED Diagnoses Final diagnoses:  Facial laceration, initial encounter  Minor head injury, initial encounter    Rx / DC Orders ED Discharge Orders     None         Torrey Ballinas, Barbette Hair, MD 02/03/22 [phone]

## 2022-02-03 NOTE — ED Triage Notes (Signed)
Patient tripped and hit her head against the filing cabinet this evening , denies LOC/alert and oriented , presents with deep skin tear/laceration at right temporal area with minimal bleeding . No anticoagulant medications taken .

## 2022-02-03 NOTE — ED Provider Notes (Signed)
..  Laceration Repair  Date/Time: 02/03/2022 3:52 AM  Performed by: Carroll Sage, PA-C Authorized by: Carroll Sage, PA-C   Consent:    Consent obtained:  Verbal   Consent given by:  Patient   Risks discussed:  Infection, pain, retained foreign body, need for additional repair, poor cosmetic result, tendon damage, vascular damage, poor wound healing and nerve damage   Alternatives discussed:  No treatment, delayed treatment, observation and referral Universal protocol:    Patient identity confirmed:  Verbally with patient Anesthesia:    Anesthesia method:  Local infiltration   Local anesthetic:  Lidocaine 2% WITH epi Laceration details:    Location:  Scalp   Scalp location:  R temporal   Length (cm):  17   Depth (mm):  3 Pre-procedure details:    Preparation:  Patient was prepped and draped in usual sterile fashion and imaging obtained to evaluate for foreign bodies Exploration:    Limited defect created (wound extended): no     Imaging obtained: x-ray     Imaging outcome: foreign body not noted     Wound exploration: wound explored through full range of motion and entire depth of wound visualized     Contaminated: no   Treatment:    Area cleansed with:  Saline   Amount of cleaning:  Extensive   Visualized foreign bodies/material removed: no   Skin repair:    Repair method:  Sutures   Suture size:  5-0   Suture material:  Prolene   Suture technique:  Simple interrupted   Number of sutures:  21 Approximation:    Approximation:  Close Repair type:    Repair type:  Intermediate Post-procedure details:    Dressing:  Non-adherent dressing   Procedure completion:  Tolerated well, no immediate complications     Carroll Sage, PA-C 02/03/22 [id number]    Shon Baton, MD 02/03/22 [phone]

## 2022-02-09 LAB — COLOGUARD: COLOGUARD: NEGATIVE

## 2022-10-20 ENCOUNTER — Other Ambulatory Visit: Payer: Self-pay | Admitting: Nephrology

## 2022-10-20 DIAGNOSIS — N1831 Chronic kidney disease, stage 3a: Secondary | ICD-10-CM

## 2022-10-29 ENCOUNTER — Encounter (HOSPITAL_COMMUNITY): Payer: Medicare Other

## 2022-10-30 ENCOUNTER — Encounter (HOSPITAL_COMMUNITY): Payer: Medicare Other

## 2022-10-30 ENCOUNTER — Other Ambulatory Visit: Payer: Self-pay | Admitting: Nephrology

## 2022-10-30 ENCOUNTER — Ambulatory Visit (HOSPITAL_COMMUNITY)
Admission: RE | Admit: 2022-10-30 | Discharge: 2022-10-30 | Disposition: A | Discharge status: Home / Self Care | Payer: Medicare Other | Source: Ambulatory Visit | Attending: Cardiology | Admitting: Cardiology

## 2022-10-30 DIAGNOSIS — N1831 Chronic kidney disease, stage 3a: Secondary | ICD-10-CM | POA: Insufficient documentation

## 2023-01-05 ENCOUNTER — Encounter (HOSPITAL_COMMUNITY): Payer: Self-pay

## 2023-01-05 ENCOUNTER — Ambulatory Visit (INDEPENDENT_AMBULATORY_CARE_PROVIDER_SITE_OTHER): Payer: Medicare Other

## 2023-01-05 ENCOUNTER — Ambulatory Visit (HOSPITAL_COMMUNITY)
Admission: EM | Admit: 2023-01-05 | Discharge: 2023-01-05 | Disposition: A | Discharge status: Home / Self Care | Payer: Medicare Other | Attending: Family Medicine | Admitting: Family Medicine

## 2023-01-05 DIAGNOSIS — M25551 Pain in right hip: Secondary | ICD-10-CM

## 2023-01-05 DIAGNOSIS — M533 Sacrococcygeal disorders, not elsewhere classified: Secondary | ICD-10-CM | POA: Diagnosis not present

## 2023-01-05 DIAGNOSIS — M545 Low back pain, unspecified: Secondary | ICD-10-CM

## 2023-01-05 MED ORDER — OXYCODONE-ACETAMINOPHEN 5-325 MG PO TABS: 1.0000 | ORAL_TABLET | Freq: Four times a day (QID) | ORAL | 0 refills | Status: DC | PRN

## 2023-01-05 NOTE — ED Triage Notes (Signed)
Pt states she fell straight back on her back a week ago on hardwood floors in her office. States tripped over the old carpet. States yesterday starting having lower back pain and radiating down rt leg. States took a hydrocodone at 11am.

## 2023-01-05 NOTE — Discharge Instructions (Signed)
Be aware, you have been prescribed pain medications that may cause drowsiness. While taking this medication, do not take any other medications containing acetaminophen (Tylenol). Do not combine with alcohol or recreational drugs. Please do not drive, operate heavy machinery, or take part in activities that require making important decisions while on this medication as your judgement may be clouded.  

## 2023-01-06 NOTE — ED Provider Notes (Signed)
[facility]   [id number] 01/05/23 Arrival Time: 1300  ASSESSMENT & PLAN:  1. Acute midline low back pain without sciatica   2. Coccyx pain   3. Right hip pain    I have personally viewed and independently interpreted the imaging studies ordered this visit. Ques sacral fracture. Agree with radiology interpretation.  Able to ambulate here and hemodynamically stable.  Meds ordered this encounter  Medications   oxyCODONE-acetaminophen (PERCOCET/ROXICET) 5-325 MG tablet    Sig: Take 1-2 tablets by mouth every 6 (six) hours as needed for severe pain.    Dispense:  20 tablet    Refill:  0   [place] Controlled Substances Registry consulted for this patient. I feel the risk/benefit ratio today is favorable for proceeding with this prescription for a controlled substance. Medication sedation precautions given. Medication sedation precautions given. Encourage ROM/movement as tolerated.  Recommend:  Follow-up Information     Schedule an appointment as soon as possible for a visit  with Sheral Apley, MD.   Specialty: Orthopedic Surgery Contact information: [address] [place] [address] [phone]                 Reviewed expectations re: course of current medical issues. Questions answered. Outlined signs and symptoms indicating need for more acute intervention. Patient verbalized understanding. After Visit Summary given.   SUBJECTIVE: History from: patient.  Terry Allison is a 77 y.o. female who presents with complaint of low back pain/buttock pain s/p fall one week ago. Mild radicular pain of R leg today. Is ambulatory but slow. Pain not improving. Denies abd pain. Extremity sensation changes or weakness: none. Normal bowel/bladder habits: yes; without urinary retention. Normal PO intake without n/v. No associated abdominal pain/n/v. Hydrocodone did help a little; took at 11 am today; old Rx. Also reports pain over R hip the  last few days.   OBJECTIVE:  Vitals:   01/05/23 1535  BP: (!) 185/69  Pulse: (!) 54  Resp: 18  Temp: 98.2 F (36.8 C)  TempSrc: Oral  SpO2: 98%    General appearance: alert; no distress HEENT: [place]; AT Neck: supple with FROM; without midline tenderness CV: regular Lungs: unlabored respirations; speaks full sentences without difficulty Abdomen: soft, non-tender; non-distended Back: significant and poorly localized tenderness to palpation over lumbar spine into upper midline buttock ; no bruising FROM at waist; bruising Extremities: without edema; symmetrical without gross deformities; normal ROM of bilateral LE; is TTP over R hip Skin: warm and dry Neurologic: normal gait; normal sensation and strength and reflexes of bilateral LE Psychological: alert and cooperative; normal mood and affect   Imaging: DG Sacrum/Coccyx  Result Date: 01/05/2023 CLINICAL DATA:  Status post fall. Low back pain radiating down right leg. EXAM: SACRUM AND COCCYX - 2+ VIEW; DG HIP (WITH OR WITHOUT PELVIS) 1V RIGHT COMPARISON:  None Available. FINDINGS: Sacrum and coccyx: The bones are diffusely under mineralized limiting assessment. There is slight cortical buckling of the S1 segment that may represent a nondisplaced fracture. This is seen only on the lateral view. No gross disruption of sacral ala. The sacroiliac joints are congruent. Right hip: Single frontal view of the right hip obtained. No acute fracture. Hip joint space is preserved. The pubic rami are intact. Pubic symphysis is congruent.: IMPRESSION: 1. Slight cortical buckling of the S1 segment may represent a nondisplaced fracture. No gross disruption of the sacral ala. 2. No fracture of the right hip. Electronically Signed   By: Shawna Orleans  Sanford M.D.   On: 01/05/2023 16:46   DG Hip Unilat W or Wo Pelvis 1 View Right  Result Date: 01/05/2023 CLINICAL DATA:  Status post fall. Low back pain radiating down right leg. EXAM: SACRUM AND COCCYX - 2+ VIEW;  DG HIP (WITH OR WITHOUT PELVIS) 1V RIGHT COMPARISON:  None Available. FINDINGS: Sacrum and coccyx: The bones are diffusely under mineralized limiting assessment. There is slight cortical buckling of the S1 segment that may represent a nondisplaced fracture. This is seen only on the lateral view. No gross disruption of sacral ala. The sacroiliac joints are congruent. Right hip: Single frontal view of the right hip obtained. No acute fracture. Hip joint space is preserved. The pubic rami are intact. Pubic symphysis is congruent.: IMPRESSION: 1. Slight cortical buckling of the S1 segment may represent a nondisplaced fracture. No gross disruption of the sacral ala. 2. No fracture of the right hip. Electronically Signed   By: Narda Rutherford M.D.   On: 01/05/2023 16:46   DG Lumbar Spine Complete  Result Date: 01/05/2023 CLINICAL DATA:  Pain after fall. Fall 1 week ago. Low back pain radiating down right leg. EXAM: LUMBAR SPINE - COMPLETE 4+ VIEW COMPARISON:  Lumbar radiograph 04/03/2017 FINDINGS: The bones are subjectively under mineralized. This limits detailed assessment. There are 5 non-rib-bearing lumbar vertebra. Broad-based dextroscoliotic curvature is unchanged from prior exam. Progressive grade 2 anterolisthesis of L4 on L5 of 11 mm, previously 5 mm. No acute fracture or compression deformity. Degenerative disc disease, most prominent at L3-L4 and L4-L5. Moderate L4-L5 facet hypertrophy. Advanced aortic atherosclerosis. IMPRESSION: 1. No acute fracture of the lumbar spine. 2. Progressive grade 2 anterolisthesis of L4 on L5 from 2018 exam. 3. Multilevel degenerative disc disease and facet hypertrophy, most prominent at L4-L5. Electronically Signed   By: Narda Rutherford M.D.   On: 01/05/2023 16:43    Allergies  Allergen Reactions   Tramadol Nausea And Vomiting    Falling down     Past Medical History:  Diagnosis Date   Allergy    Asthma    COPD (chronic obstructive pulmonary disease) (HCC)     High cholesterol    Hypertension    Neuromuscular disorder (HCC)    Thyroid disease    Social History   Socioeconomic History   Marital status: Single    Spouse name: Not on file   Number of children: Not on file   Years of education: Not on file   Highest education level: Not on file  Occupational History   Not on file  Tobacco Use   Smoking status: Former    Current packs/day: 0.00    Types: Cigarettes    Quit date: 07/22/1986    Years since quitting: 36.4   Smokeless tobacco: Never  Vaping Use   Vaping status: Never Used  Substance and Sexual Activity   Alcohol use: Yes    Alcohol/week: 4.0 standard drinks of alcohol    Types: 4 Glasses of wine per week    Comment: daily 3-4 glasses of wine   Drug use: No   Sexual activity: Not on file  Other Topics Concern   Not on file  Social History Narrative   ** Merged History Encounter **       Social Determinants of Health   Financial Resource Strain: Not on file  Food Insecurity: Not on file  Transportation Needs: Not on file  Physical Activity: Not on file  Stress: Not on file  Social Connections: Not on file  Intimate Partner Violence: Not on file   Family History  Problem Relation Age of Onset   Heart disease Father    Mental illness Father    Cancer Sister    Hyperlipidemia Sister    Diabetes Mother    Past Surgical History:  Procedure Laterality Date   FRACTURE SURGERY     INTRAMEDULLARY (IM) NAIL INTERTROCHANTERIC Left 12/26/2019   Procedure: LEFT INTRAMEDULLARY  NAIL INTERTROCHANTRIC;  Surgeon: Sheral Apley, MD;  Location: [facility] OR;  Service: Orthopedics;  Laterality: Left;   LOWER EXTREMITY ANGIOGRAPHY N/A 03/29/2018   Procedure: LOWER EXTREMITY ANGIOGRAPHY;  Surgeon: Yates Decamp, MD;  Location: [facility] INVASIVE CV LAB;  Service: Cardiovascular;  Laterality: N/A;   ORIF TIBIA PLATEAU Right 03/05/2014   Procedure: OPEN REDUCTION INTERNAL FIXATION (ORIF) TIBIAL PLATEAU;  Surgeon: Sheral Apley, MD;   Location: [facility] OR;  Service: Orthopedics;  Laterality: Right;   SPINE SURGERY     TONSILECTOMY/ADENOIDECTOMY WITH MYRINGOTOMY     TUBAL LIGATION        Mardella Layman, MD 01/06/23 [phone]

## 2023-03-17 LAB — LAB REPORT - SCANNED: EGFR: 59

## 2023-05-28 ENCOUNTER — Encounter: Payer: Self-pay | Admitting: Cardiology

## 2023-05-28 ENCOUNTER — Ambulatory Visit: Payer: Medicare Other | Attending: Cardiology | Admitting: Cardiology

## 2023-05-28 VITALS — BP 130/70 | HR 57 | Resp 16 | Ht 60.0 in | Wt 115.0 lb

## 2023-05-28 DIAGNOSIS — I10 Essential (primary) hypertension: Secondary | ICD-10-CM

## 2023-05-28 DIAGNOSIS — N1831 Chronic kidney disease, stage 3a: Secondary | ICD-10-CM | POA: Diagnosis not present

## 2023-05-28 DIAGNOSIS — E7800 Pure hypercholesterolemia, unspecified: Secondary | ICD-10-CM

## 2023-05-28 DIAGNOSIS — I739 Peripheral vascular disease, unspecified: Secondary | ICD-10-CM

## 2023-05-28 NOTE — Progress Notes (Unsigned)
Cardiology Office Note:  .   Date:  05/29/2023  ID:  Terry Allison, DOB 05/09/1946, MRN [id number] PCP: Dois Davenport, MD  [facility] Providers Cardiologist:  Yates Decamp, MD   History of Present Illness: .   Terry Allison is a 77 y.o. Caucasian female patient with hypertension, hypercholesterolemia, 50-pack-year history of smoking quit in 1980s, COPD, peripheral arterial disease with around 50% stenosis of right common iliac artery by angiography in 2019, atrophic right kidney measuring 7.36 and normal left kidney at 10.56 with a bilateral moderate renal artery stenosis of 1 to 59% by duplex on 10/30/2022 referred back to me by PCP for cardiac evaluation and vascular evaluation.  Patient remains asymptomatic except for chronic back pain.  Discussed the use of AI scribe software for clinical note transcription with the patient, who gave verbal consent to proceed.  History of Present Illness   The patient, with a history of hypertension, kidney disease, and peripheral arterial disease, presents with lower back pain that radiates into the legs when standing. The pain has worsened since a fall in July, which resulted in a fractured tailbone and L5 vertebra. Despite these injuries, the patient remains active and busy.  The patient's hypertension is well controlled on a regimen of spironolactone, metoprolol, and amlodipine. She also takes atorvastatin for cholesterol management. The patient reports that her kidney function has remained stable and even improved slightly since starting this regimen.  The patient's peripheral arterial disease is reportedly mild, with no changes in physical exam findings. The patient's only concern is the persistent lower back pain, which she acknowledges will need to be addressed at some point.      Review of Systems  Cardiovascular:  Negative for chest pain, dyspnea on exertion and leg swelling.   Labs   External Labs:  Labs  03/17/2023:  Serum glucose 98 mg, BUN 16, creatinine 1.0, EGFR 59 mL, potassium 4.8.  Hb 13.1/HCT 39.2, platelets 247, normal indicis.  Physical Exam:   VS:  BP 130/70 (BP Location: Left Arm, Patient Position: Sitting, Cuff Size: Normal)   Pulse (!) 57   Resp 16   Ht 5' (1.524 m)   Wt 115 lb (52.2 kg)   SpO2 96%   BMI 22.46 kg/m    Wt Readings from Last 3 Encounters:  05/28/23 115 lb (52.2 kg)  03/24/20 105 lb (47.6 kg)  12/26/19 109 lb 12.6 oz (49.8 kg)     Physical Exam Neck:     Vascular: No carotid bruit or JVD.  Cardiovascular:     Rate and Rhythm: Normal rate and regular rhythm.     Pulses: Intact distal pulses.          Femoral pulses are 2+ on the right side and 2+ on the left side.      Dorsalis pedis pulses are 1+ on the right side and 2+ on the left side.       Posterior tibial pulses are 1+ on the right side and 2+ on the left side.     Heart sounds: Normal heart sounds. No murmur heard.    No gallop.  Pulmonary:     Effort: Pulmonary effort is normal.     Breath sounds: Normal breath sounds.  Abdominal:     General: Bowel sounds are normal.     Palpations: Abdomen is soft.  Musculoskeletal:     Right lower leg: No edema.     Left lower leg: No edema.  Studies Reviewed: .    Echocardiogram 12/26/2019: 1. Accelerated blood flow through left ventricular outflow tract (source of murmur). Left ventricular ejection fraction, by estimation, is 70 to 75%. The left ventricle has hyperdynamic function. The left ventricle has no regional wall motion abnormalities. There is mild asymmetric left ventricular hypertrophy of the septal segment. Left ventricular diastolic parameters are consistent with Grade I diastolic dysfunction (impaired relaxation). 2. Right ventricular systolic function is normal. The right ventricular size is normal. There is mildly elevated pulmonary artery systolic pressure. The estimated right ventricular systolic pressure is 38.0 mmHg. 3. The  mitral valve is normal in structure. No evidence of mitral valve regurgitation. No evidence of mitral stenosis. 4. The aortic valve is tricuspid. Aortic valve regurgitation is not visualized. Mild to moderate aortic valve sclerosis/calcification is present, without any evidence of aortic stenosis. 5. The inferior vena cava is normal in size with greater than 50% respiratory variability, suggesting right atrial pressure of 3 mmHg  Renal artery duplex 10/30/2022: Right: 1-59% stenosis of the right renal artery. Abnormal size for         the right kidney. Abnormal cortical thickness of right         kidney. Echogenicity of kidney is hyperechoic with minimal         color perfusion seen throughout renal parenchyma.  Left:  1-59% stenosis of the left renal artery. Normal size of left         kidney. Normal cortical thickness of the left kidney.         Abnormal left Resisitve Index. LRV flow present.   EKG:    EKG Interpretation Date/Time:  Friday May 28 2023 15:55:29 EST Ventricular Rate:  56 PR Interval:  162 QRS Duration:  60 QT Interval:  408 QTC Calculation: 393 R Axis:   70  Text Interpretation: EKG 05/28/2023: Normal sinus rhythm at rate of 56 bpm, normal EKG.  Compared to 12/25/2019, PACs not present. Confirmed by Delrae Rend [phone]) on 05/28/2023 4:09:51 PM    Medications and allergies    Allergies  Allergen Reactions   Tramadol Nausea And Vomiting    Falling down      Current Outpatient Medications:    Albuterol Sulfate (PROAIR RESPICLICK) 108 (90 Base) MCG/ACT AEPB, Inhale 1 Inhaler into the lungs every 4 (four) hours as needed. (Patient taking differently: Inhale 2 Inhalers into the lungs every 4 (four) hours as needed (shortness of breath).), Disp: 1 each, Rfl: 1   allopurinol (ZYLOPRIM) 100 MG tablet, Take 1 tablet (100 mg total) by mouth daily., Disp: 30 tablet, Rfl: 0   amLODipine (NORVASC) 5 MG tablet, Take 1 tablet (5 mg total) by mouth daily. No more  refills without office visit, Disp: 90 tablet, Rfl: 0   aspirin EC 81 MG tablet, Take 81 mg by mouth daily., Disp: , Rfl:    atorvastatin (LIPITOR) 40 MG tablet, TAKE 1 TABLET BY MOUTH EVERY DAY (Patient taking differently: Take 40 mg by mouth daily.), Disp: 30 tablet, Rfl: 0   Blood Pressure Monitoring (BLOOD PRESSURE KIT) DEVI, 1 Units by Does not apply route once a week., Disp: 1 Device, Rfl: 0   calcipotriene (DOVONOX) 0.005 % cream, Apply 1 application  topically in the morning., Disp: , Rfl:    cholecalciferol (VITAMIN D) 1000 units tablet, Take 1,000 Units by mouth daily., Disp: , Rfl:    fluticasone (FLONASE) 50 MCG/ACT nasal spray, Place 2 sprays into both nostrils 2 (two) times daily. (Patient taking differently:  Place 2 sprays into both nostrils 2 (two) times daily as needed for rhinitis.), Disp: 16 g, Rfl: 6   ipratropium (ATROVENT) 0.03 % nasal spray, PLACE 2 SPRAYS INTO THE NOSE 4 (FOUR) TIMES DAILY. (Patient taking differently: Place 2 sprays into the nose 4 (four) times daily as needed for rhinitis.), Disp: 30 mL, Rfl: 0   levothyroxine (SYNTHROID, LEVOTHROID) 75 MCG tablet, TAKE 1 TABLET BY MOUTH EVERY DAY (Patient taking differently: Take 75 mcg by mouth daily before breakfast.), Disp: 30 tablet, Rfl: 0   LORazepam (ATIVAN) 1 MG tablet, Take 1 tablet (1 mg total) by mouth 3 (three) times daily as needed for anxiety., Disp: 10 tablet, Rfl: 0   metoprolol tartrate (LOPRESSOR) 50 MG tablet, TAKE 1 TABLET (50 MG TOTAL) BY MOUTH 2 (TWO) TIMES DAILY. (Patient taking differently: Take 25 mg by mouth 2 (two) times daily.), Disp: 60 tablet, Rfl: 0   mirtazapine (REMERON) 15 MG tablet, Take 15 mg by mouth at bedtime., Disp: , Rfl:    Prenatal Vit-Fe Fumarate-FA (PRENATAL VITAMIN PLUS LOW IRON) 27-1 MG TABS, Take 1 tablet by mouth every evening., Disp: , Rfl: 0   spironolactone (ALDACTONE) 25 MG tablet, Take 25 mg by mouth daily., Disp: , Rfl:    thiamine (VITAMIN B-1) 100 MG tablet, Take 100 mg  by mouth daily., Disp: , Rfl:    ASSESSMENT AND PLAN: .      ICD-10-CM   1. Essential hypertension  I10 EKG 12-Lead    2. Peripheral artery disease (HCC)  I73.9     3. Hypercholesteremia  E78.00     4. Stage 3a chronic kidney disease (HCC)  N18.31      Assessment and Plan    Hypertension Well controlled on current regimen of spironolactone 25mg  daily, metoprolol 50mg  twice daily, and amlodipine 5mg  daily. -Continue current regimen.  Chronic Kidney Disease Stable mild dysfunction with only one kidney. -Continue current regimen. -She is also being followed by nephrology.  I do not have her recent labs.  Hyperlipidemia On atorvastatin 40mg  daily. -Continue atorvastatin 40mg  daily. -In view of PAD, goal LDL <70.  This is being managed by her PCP.  Lower Back Pain Pain radiating down legs, exacerbated by standing. History of L5 fracture and tailbone fracture. -Continue current management.  Peripheral Arterial Disease Mild blockages in legs, no change in physical exam. -No intervention needed at this time. -Pedal pulses are intact and capillary refill <3 seconds.  Symptoms of back pain limiting her activity.  Overall continue primary/secondary prevention, blood pressure is well-controlled on present medical regimen, she has had renal artery duplex which I reviewed, no significant renal artery stenosis on the left and atrophic kidney on the right.  Follow-up as needed.   Signed,  Yates Decamp, MD, [facility] 05/29/2023, 6:23 AM [facility] [address] [place], [place] [address] Phone: [phone]. Fax:  [phone]

## 2023-05-28 NOTE — Patient Instructions (Signed)
Follow-Up: At [facility], you and your health needs are our priority.  As part of our continuing mission to provide you with exceptional heart care, we have created designated Provider Care Teams.  These Care Teams include your primary Cardiologist (physician) and Advanced Practice Providers (APPs -  Physician Assistants and Nurse Practitioners) who all work together to provide you with the care you need, when you need it.  Your next appointment:   As Needed  Provider:   Yates Decamp, MD

## 2023-10-11 ENCOUNTER — Other Ambulatory Visit: Payer: Self-pay | Admitting: Nephrology

## 2023-10-11 DIAGNOSIS — N1831 Chronic kidney disease, stage 3a: Secondary | ICD-10-CM

## 2023-10-21 ENCOUNTER — Ambulatory Visit
Admission: RE | Admit: 2023-10-21 | Discharge: 2023-10-21 | Disposition: A | Discharge status: Home / Self Care | Source: Ambulatory Visit | Attending: Nephrology | Admitting: Nephrology

## 2023-10-21 DIAGNOSIS — N1831 Chronic kidney disease, stage 3a: Secondary | ICD-10-CM
